# Patient Record
Sex: Female | Born: 1965 | Race: Black or African American | Hispanic: No | Smoking: Never smoker
Health system: Southern US, Community
[De-identification: ages and names within clinical notes are randomized; demographics above are authoritative.]

## PROBLEM LIST (undated history)

## (undated) DIAGNOSIS — R030 Elevated blood-pressure reading, without diagnosis of hypertension: Secondary | ICD-10-CM

## (undated) DIAGNOSIS — E221 Hyperprolactinemia: Secondary | ICD-10-CM

## (undated) DIAGNOSIS — Z8719 Personal history of other diseases of the digestive system: Secondary | ICD-10-CM

## (undated) DIAGNOSIS — M51369 Other intervertebral disc degeneration, lumbar region without mention of lumbar back pain or lower extremity pain: Secondary | ICD-10-CM

## (undated) DIAGNOSIS — K644 Residual hemorrhoidal skin tags: Secondary | ICD-10-CM

## (undated) DIAGNOSIS — K219 Gastro-esophageal reflux disease without esophagitis: Secondary | ICD-10-CM

## (undated) DIAGNOSIS — D352 Benign neoplasm of pituitary gland: Secondary | ICD-10-CM

## (undated) DIAGNOSIS — Z86018 Personal history of other benign neoplasm: Secondary | ICD-10-CM

## (undated) DIAGNOSIS — R202 Paresthesia of skin: Secondary | ICD-10-CM

## (undated) DIAGNOSIS — Z9889 Other specified postprocedural states: Secondary | ICD-10-CM

## (undated) DIAGNOSIS — D649 Anemia, unspecified: Secondary | ICD-10-CM

## (undated) DIAGNOSIS — M5116 Intervertebral disc disorders with radiculopathy, lumbar region: Secondary | ICD-10-CM

## (undated) DIAGNOSIS — G4452 New daily persistent headache (NDPH): Secondary | ICD-10-CM

## (undated) DIAGNOSIS — G43909 Migraine, unspecified, not intractable, without status migrainosus: Secondary | ICD-10-CM

## (undated) DIAGNOSIS — G43109 Migraine with aura, not intractable, without status migrainosus: Secondary | ICD-10-CM

## (undated) DIAGNOSIS — M5136 Other intervertebral disc degeneration, lumbar region: Secondary | ICD-10-CM

## (undated) DIAGNOSIS — G4726 Circadian rhythm sleep disorder, shift work type: Secondary | ICD-10-CM

## (undated) DIAGNOSIS — G8929 Other chronic pain: Secondary | ICD-10-CM

## (undated) HISTORY — PX: BREAST EXCISIONAL BIOPSY: SUR124

## (undated) HISTORY — PX: PITUITARY SURGERY: SHX203

## (undated) HISTORY — PX: DE QUERVAIN'S RELEASE: SHX1439

---

## 1998-03-29 HISTORY — PX: PITUITARY SURGERY: SHX203

## 2019-04-13 MED FILL — SUMATRIPTAN SUCC 100 MG TAB: 100 | 90 days supply | Qty: 27 | Fill #0

## 2019-06-21 ENCOUNTER — Other Ambulatory Visit (HOSPITAL_COMMUNITY): Payer: Self-pay | Admitting: Family Medicine

## 2019-06-21 DIAGNOSIS — Z23 Encounter for immunization: Secondary | ICD-10-CM | POA: Diagnosis not present

## 2019-06-21 DIAGNOSIS — Z0000 Encounter for general adult medical examination without abnormal findings: Secondary | ICD-10-CM | POA: Diagnosis not present

## 2019-06-21 DIAGNOSIS — Z124 Encounter for screening for malignant neoplasm of cervix: Secondary | ICD-10-CM | POA: Diagnosis not present

## 2019-06-21 DIAGNOSIS — Z1231 Encounter for screening mammogram for malignant neoplasm of breast: Secondary | ICD-10-CM | POA: Diagnosis not present

## 2019-06-21 DIAGNOSIS — Z1211 Encounter for screening for malignant neoplasm of colon: Secondary | ICD-10-CM | POA: Diagnosis not present

## 2019-06-21 MED FILL — ESZOPICLONE 2 MG TAB: 2 | 90 days supply | Qty: 90 | Fill #0

## 2019-06-26 ENCOUNTER — Other Ambulatory Visit: Payer: Self-pay | Admitting: Family Medicine

## 2019-06-26 DIAGNOSIS — Z1231 Encounter for screening mammogram for malignant neoplasm of breast: Secondary | ICD-10-CM

## 2019-07-12 MED FILL — SUMATRIPTAN SUCC 100 MG TAB: 100 | 90 days supply | Qty: 27 | Fill #1

## 2019-08-06 ENCOUNTER — Ambulatory Visit
Admission: RE | Admit: 2019-08-06 | Discharge: 2019-08-06 | Disposition: A | Discharge status: Home / Self Care | Payer: 59 | Source: Ambulatory Visit | Attending: Family Medicine | Admitting: Family Medicine

## 2019-08-06 DIAGNOSIS — Z1231 Encounter for screening mammogram for malignant neoplasm of breast: Secondary | ICD-10-CM | POA: Insufficient documentation

## 2019-08-06 IMAGING — MG DIGITAL SCREENING BILAT W/ TOMO W/ CAD
6 of 10 series · 6 of 30 positions shown · non-contrast
Comparison: Previous exam(s).

CLINICAL DATA: Screening.

EXAM:
DIGITAL SCREENING BILATERAL MAMMOGRAM WITH TOMO AND CAD

[R CC synth-2D]
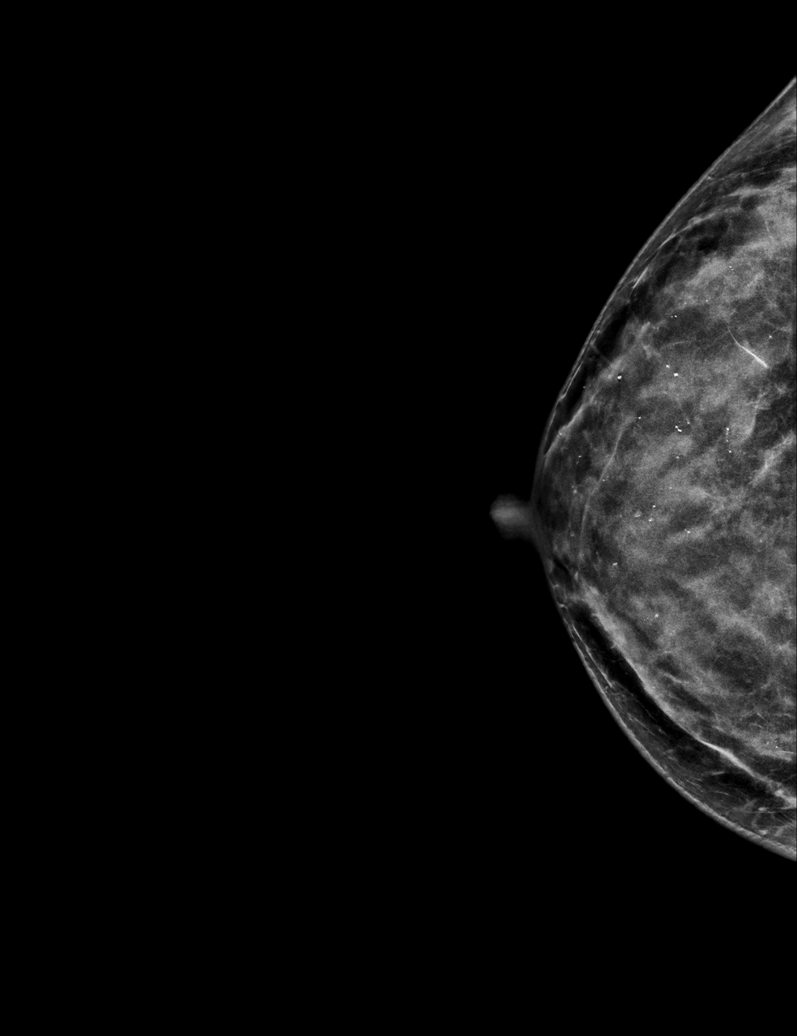

[L CC synth-2D]
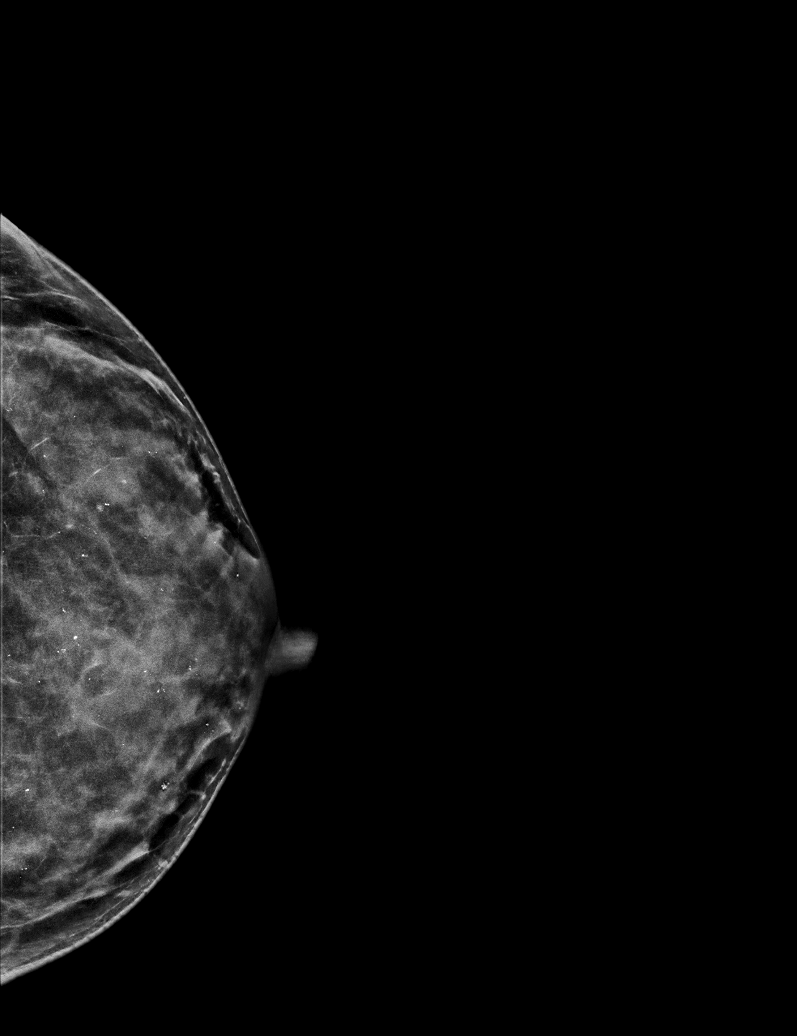

[R MLO synth-2D (1 of 2)]
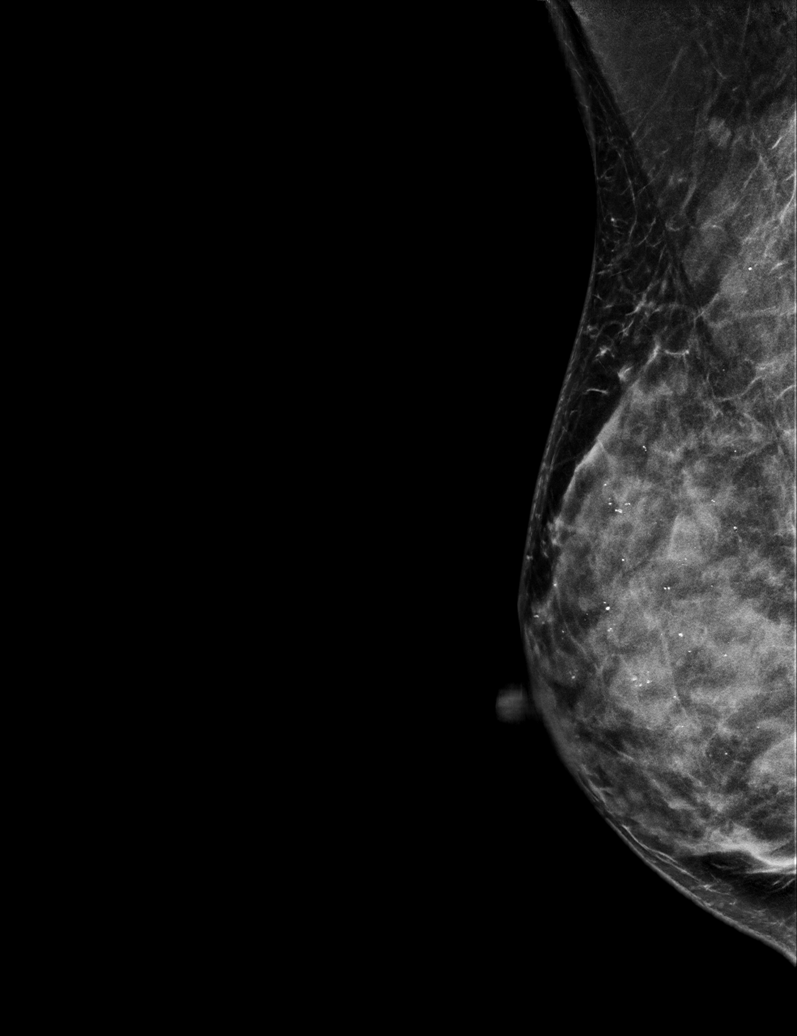

[L MLO synth-2D]
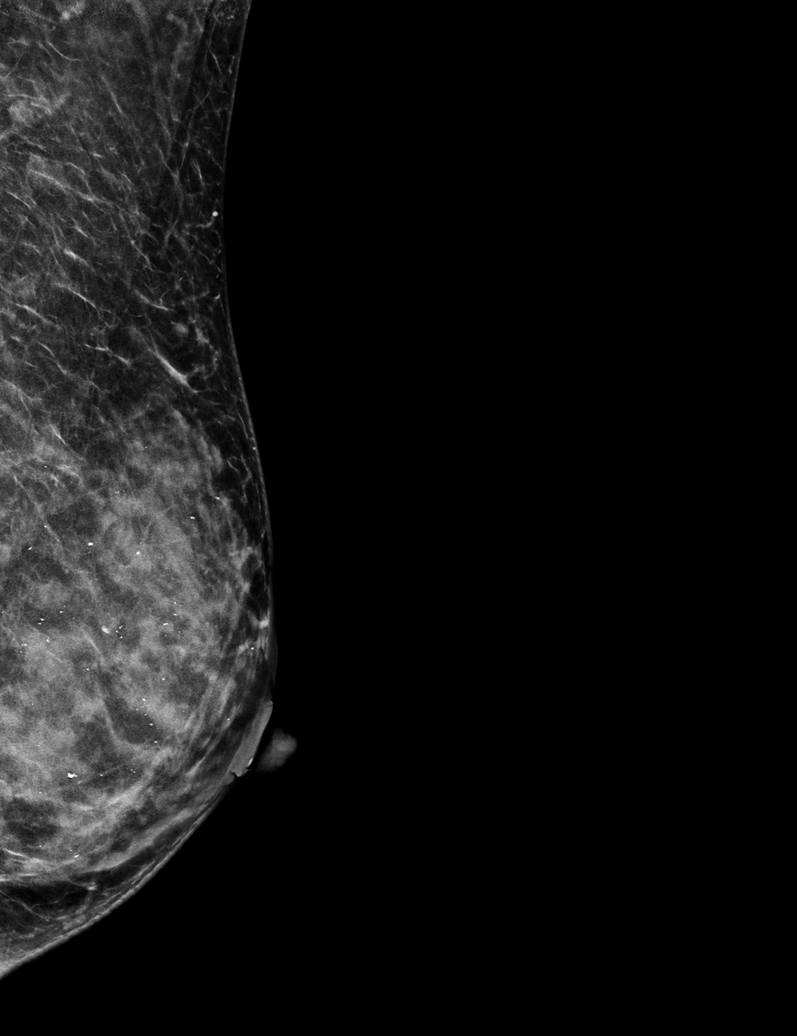

[R MLO synth-2D (2 of 2)]
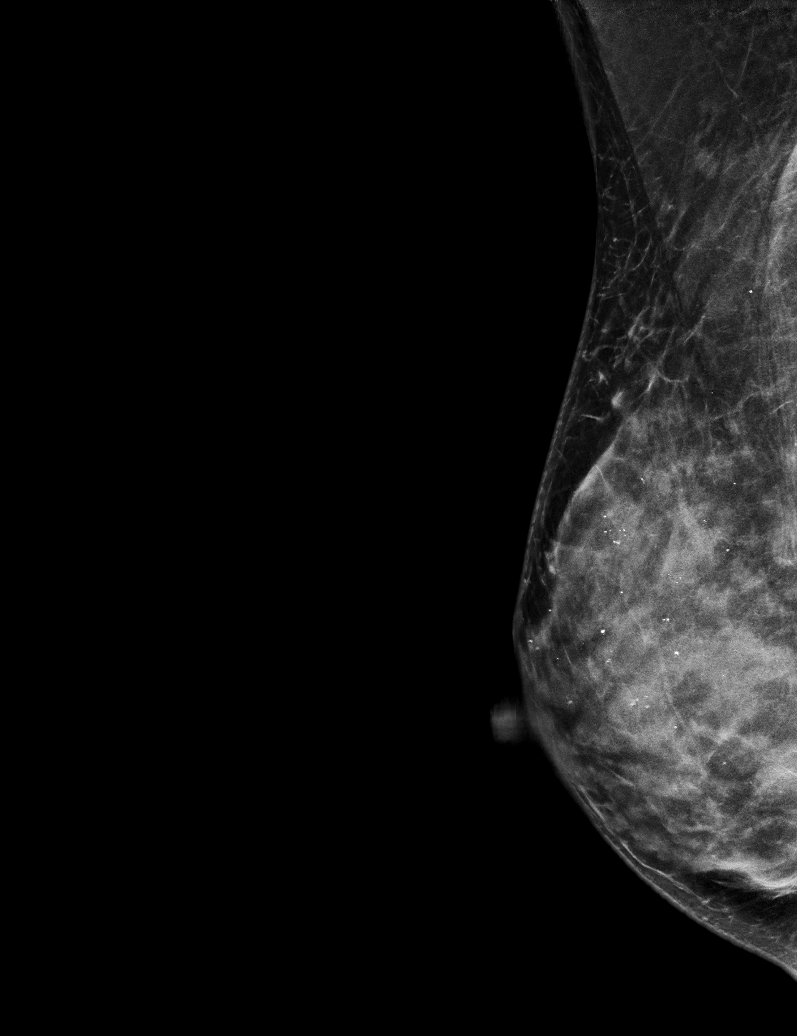

[R MLO tomo · tomo slice 29/58.0]
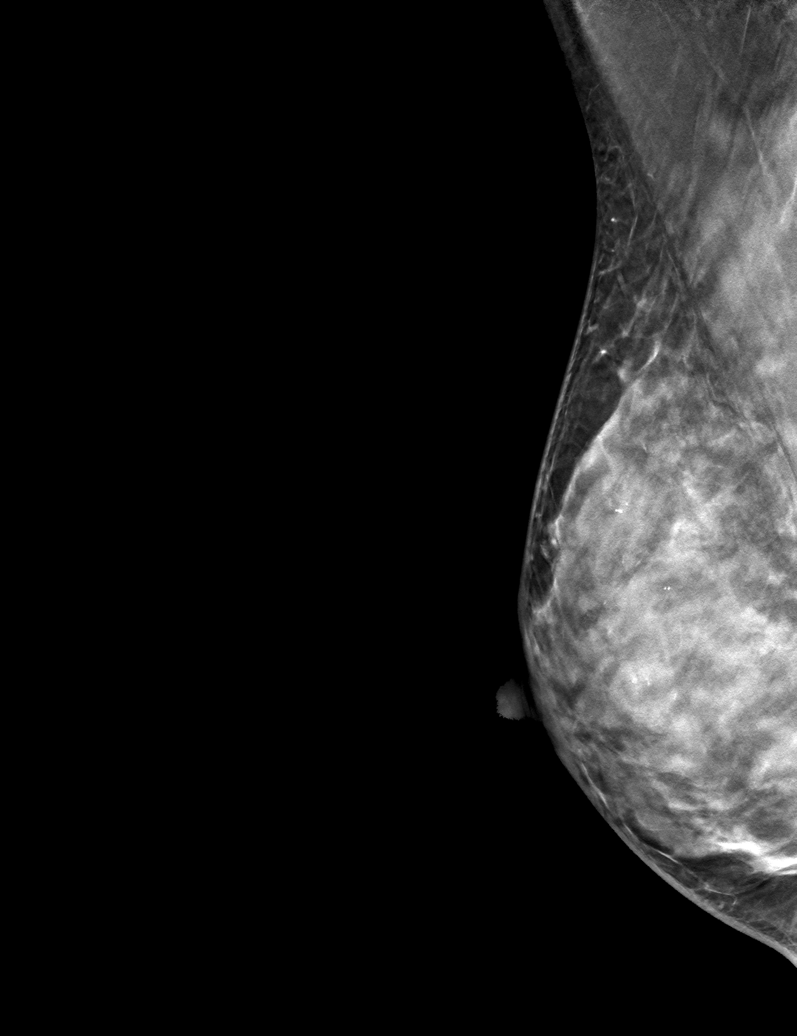

[6 of 30 positions shown; findings below may reference images not displayed]

ACR Breast Density Category c: The breast tissue is heterogeneously
dense, which may obscure small masses.
FINDINGS: There are no findings suspicious for malignancy. Images were
processed with CAD.
IMPRESSION: No mammographic evidence of malignancy. A result letter of this
screening mammogram will be mailed directly to the patient.

RECOMMENDATION:
Screening mammogram in one year. (Code:[5V])

BI-RADS CATEGORY  1: Negative.

## 2019-09-13 DIAGNOSIS — G43109 Migraine with aura, not intractable, without status migrainosus: Secondary | ICD-10-CM | POA: Diagnosis not present

## 2019-09-13 DIAGNOSIS — Z23 Encounter for immunization: Secondary | ICD-10-CM | POA: Diagnosis not present

## 2019-09-13 DIAGNOSIS — R519 Headache, unspecified: Secondary | ICD-10-CM | POA: Diagnosis not present

## 2019-09-13 DIAGNOSIS — R202 Paresthesia of skin: Secondary | ICD-10-CM | POA: Diagnosis not present

## 2019-09-13 DIAGNOSIS — G4452 New daily persistent headache (NDPH): Secondary | ICD-10-CM | POA: Diagnosis not present

## 2019-09-13 DIAGNOSIS — G4726 Circadian rhythm sleep disorder, shift work type: Secondary | ICD-10-CM | POA: Diagnosis not present

## 2019-09-13 MED FILL — FLUTICASONE PROP 50 MCG SPR: 50 | 30 days supply | Qty: 16 | Fill #0

## 2019-09-13 MED FILL — NORTRIPTYLINE HCL 10 MG CAP: 10 | 30 days supply | Qty: 60 | Fill #0

## 2019-09-24 ENCOUNTER — Other Ambulatory Visit: Payer: Self-pay | Admitting: Family Medicine

## 2019-09-24 DIAGNOSIS — Z9889 Other specified postprocedural states: Secondary | ICD-10-CM

## 2019-09-24 DIAGNOSIS — G4452 New daily persistent headache (NDPH): Secondary | ICD-10-CM

## 2019-09-24 DIAGNOSIS — E221 Hyperprolactinemia: Secondary | ICD-10-CM

## 2019-10-03 ENCOUNTER — Ambulatory Visit
Admission: RE | Admit: 2019-10-03 | Discharge: 2019-10-03 | Disposition: A | Discharge status: Home / Self Care | Payer: 59 | Source: Ambulatory Visit | Attending: Family Medicine | Admitting: Family Medicine

## 2019-10-03 ENCOUNTER — Other Ambulatory Visit: Payer: Self-pay

## 2019-10-03 DIAGNOSIS — Z86018 Personal history of other benign neoplasm: Secondary | ICD-10-CM | POA: Insufficient documentation

## 2019-10-03 DIAGNOSIS — E221 Hyperprolactinemia: Secondary | ICD-10-CM | POA: Diagnosis not present

## 2019-10-03 DIAGNOSIS — Z9889 Other specified postprocedural states: Secondary | ICD-10-CM | POA: Diagnosis not present

## 2019-10-03 DIAGNOSIS — G4452 New daily persistent headache (NDPH): Secondary | ICD-10-CM | POA: Diagnosis not present

## 2019-10-03 DIAGNOSIS — R519 Headache, unspecified: Secondary | ICD-10-CM | POA: Diagnosis not present

## 2019-10-03 IMAGING — MR MR HEAD W/O CM
12 of 13 series · 39 of 48 positions shown · non-contrast
Comparison: None.

CLINICAL DATA: Left frontal headaches facial pain 5 months.

EXAM:
MRI HEAD WITHOUT CONTRAST
TECHNIQUE: Multiplanar, multiecho pulse sequences of the brain and surrounding
structures were obtained without intravenous contrast.

[Series 6: ax dwi_tracew · axial · 3.0mm · 0.60mm/px · z∈[-136,+17]mm · 4 of 48 slices shown]
[im 1/48]
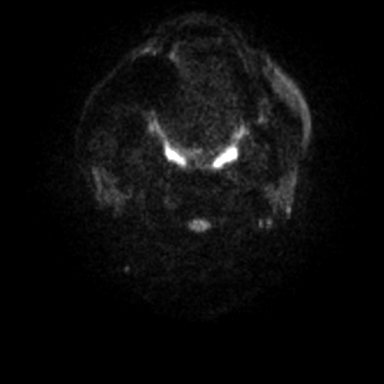
[im 16/48]
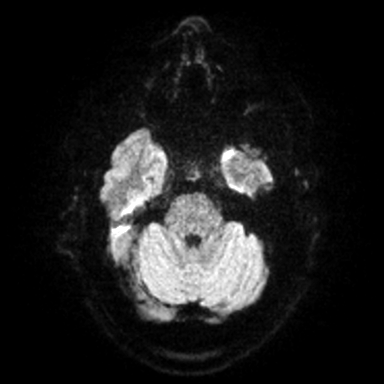
[im 32/48]
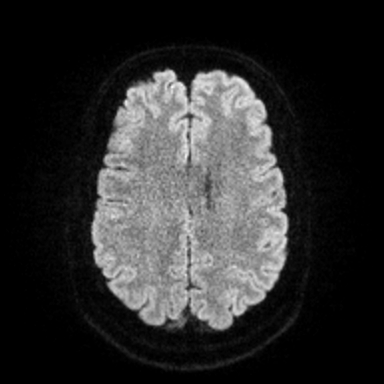
[im 48/48]
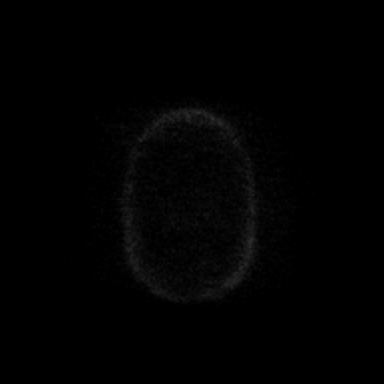

[Series 7: ax dwi_adc · axial · 3.0mm · 0.60mm/px · z∈[-136,+17]mm · 4 of 47 slices shown]
[im 1/47]
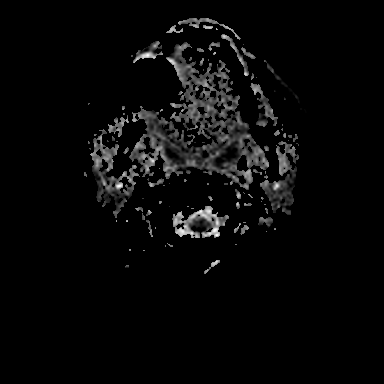
[im 16/47]
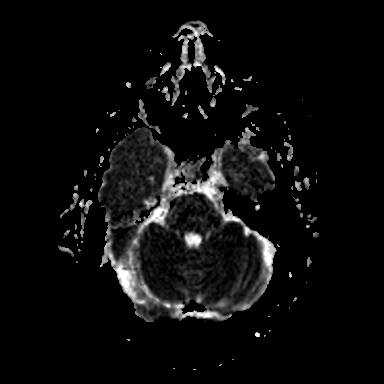
[im 31/47]
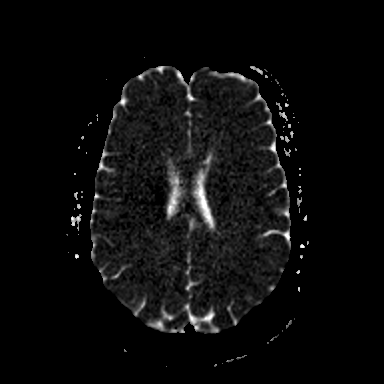
[im 47/47]
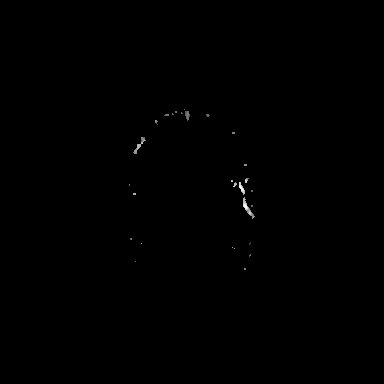

[Series 8: T2 · axial · 5.0mm · 0.53mm/px · z∈[-130,+12]mm · 2 of 25 slices shown (1 of 2)]
[im 1/25]
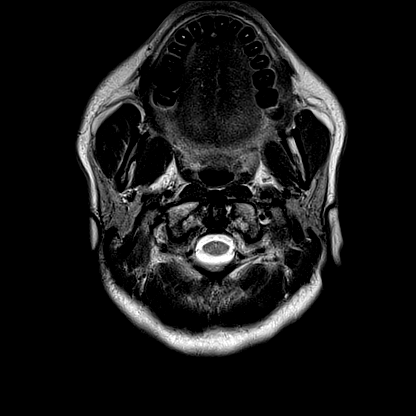
[im 25/25]
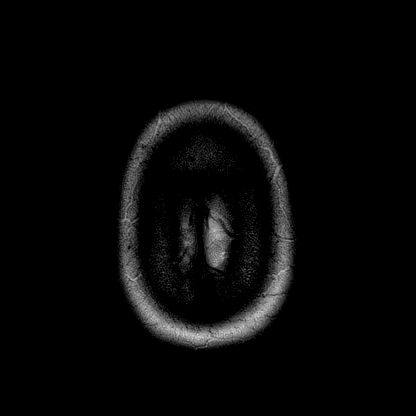

[Series 9: FLAIR · axial · 3.0mm · 0.53mm/px · z∈[-136,+18]mm · 4 of 53 slices shown]
[im 1/53]
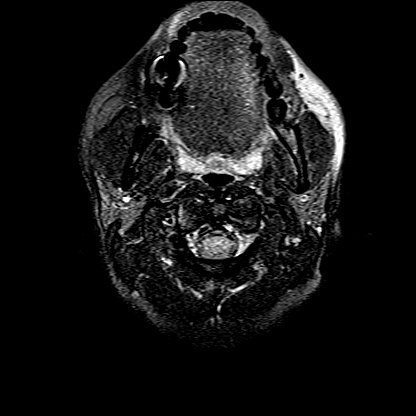
[im 18/53]
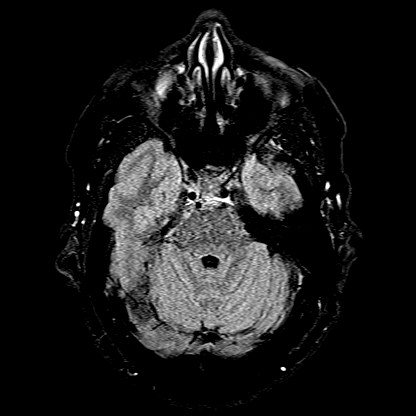
[im 35/53]
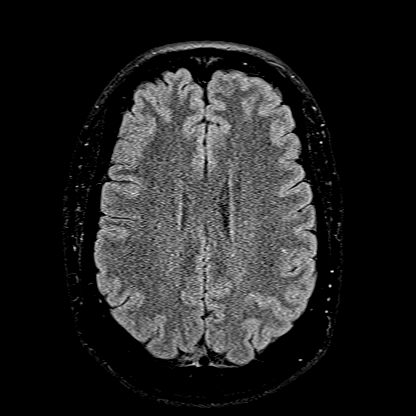
[im 53/53]
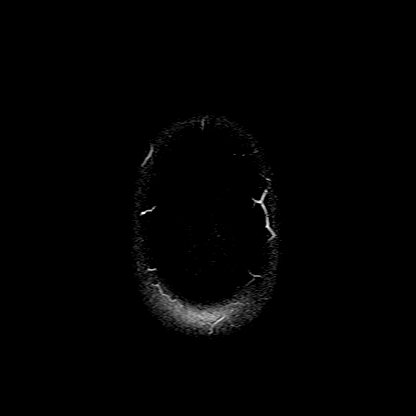

[Series 10: T1 · sagittal · non-contrast · 3.0mm · 0.21mm/px · 1 of 13 slices shown (1 of 3)]
[im 1/13]
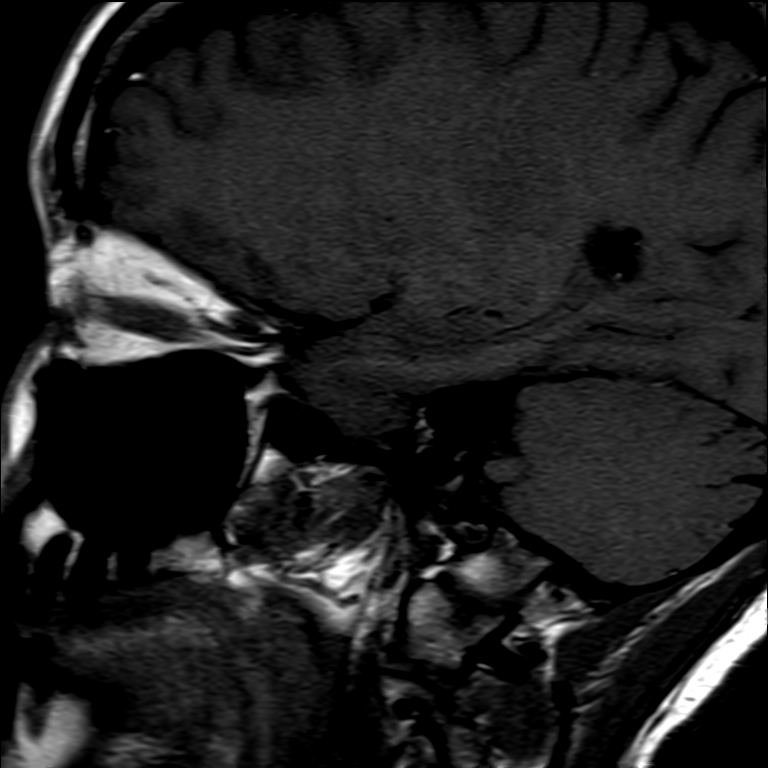

[Series 11: T1 · coronal · non-contrast · 3.0mm · 0.21mm/px · 1 of 11 slices shown (2 of 3)]
[im 1/11]
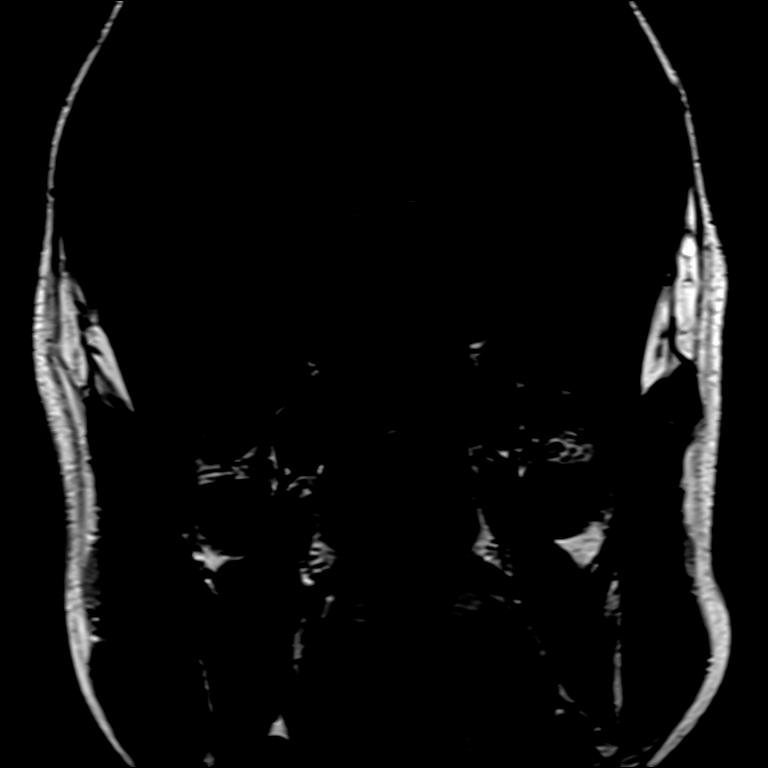

[Series 12: mag_images · axial · 3.0mm · 0.90mm/px · z∈[-136,+15]mm · 4 of 52 slices shown]
[im 1/52]
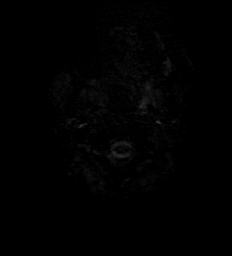
[im 18/52]
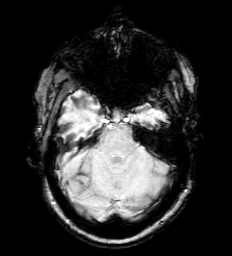
[im 35/52]
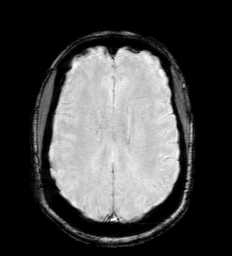
[im 52/52]
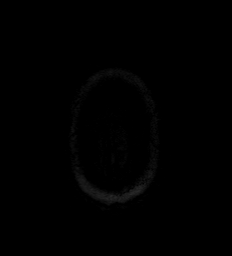

[Series 13: pha_images · axial · 3.0mm · 0.90mm/px · z∈[-136,+15]mm · 4 of 52 slices shown]
[im 1/52]
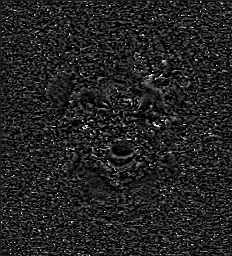
[im 18/52]
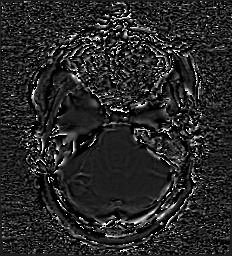
[im 35/52]
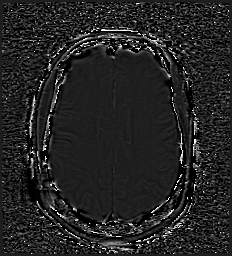
[im 52/52]
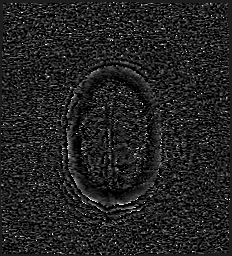

[Series 14: swi_images · axial · 3.0mm · 0.90mm/px · z∈[-136,+15]mm · 4 of 52 slices shown]
[im 1/52]
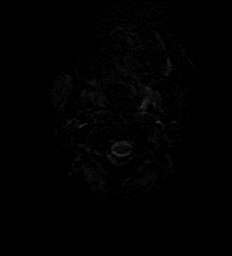
[im 18/52]
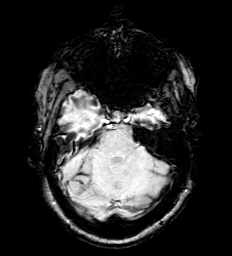
[im 35/52]
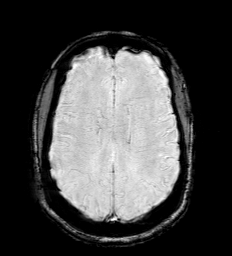
[im 52/52]
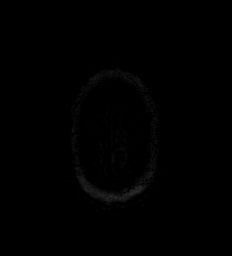

[Series 16: T1 · axial · 1.0mm · 0.98mm/px · z∈[-131,+9]mm · 8 of 144 slices shown (3 of 3)]
[im 1/144]
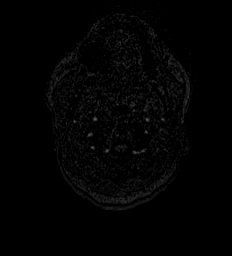
[im 27/144]
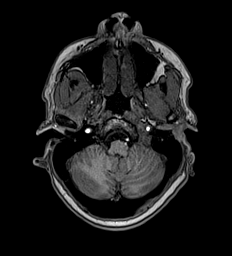
[im 40/144]
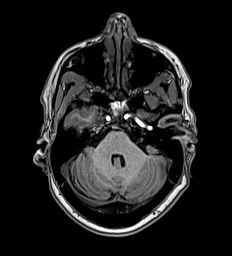
[im 66/144]
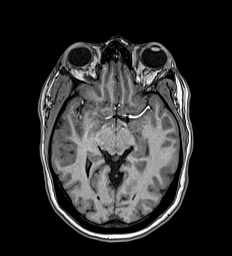
[im 79/144]
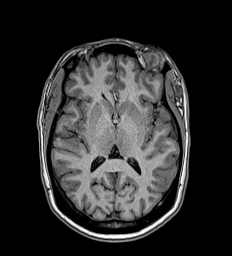
[im 105/144]
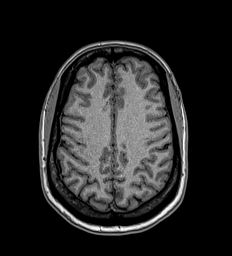
[im 118/144]
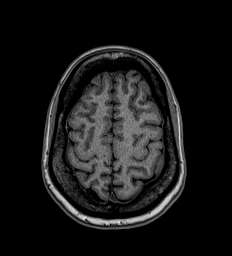
[im 144/144]
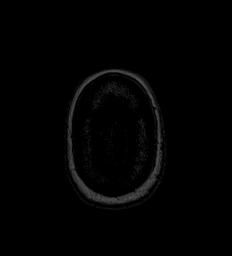

[Series 17: T2 · coronal · 5.0mm · 0.57mm/px · 2 of 27 slices shown (2 of 2)]
[im 1/27]
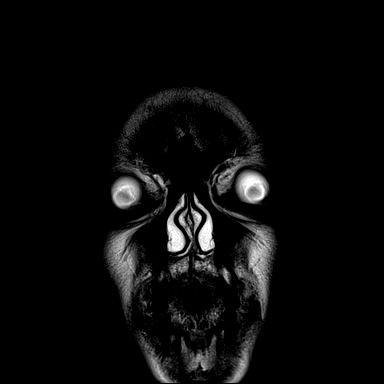
[im 27/27]
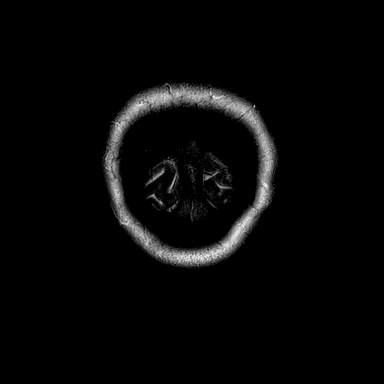

[Series 18: cor dwi_tracew · coronal · 5.0mm · 0.60mm/px · 1 of 34 slices shown]
[im 1/34]
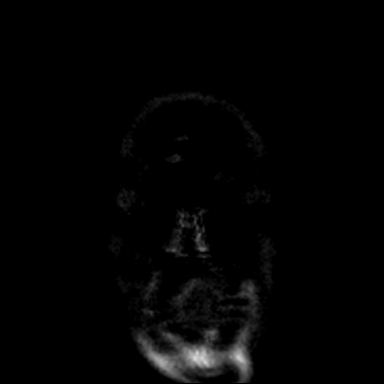

[39 of 48 positions shown; findings below may reference images not displayed]

FINDINGS: Brain: No acute infarct, acute hemorrhage or extra-axial collection.
Normal white matter signal. Normal volume of CSF spaces. Normal
midline structures. No chronic microhemorrhage. Normal unenhanced
appearance of the pituitary gland.

Vascular: Normal flow voids.

Skull and upper cervical spine: Normal marrow signal.

Sinuses/Orbits: Negative.

Other: None.
IMPRESSION: 1. Normal brain MRI.
2. Normal unenhanced appearance of the pituitary gland.

## 2019-10-04 MED FILL — SUMATRIPTAN SUCC 100 MG TAB: 100 | 90 days supply | Qty: 27 | Fill #2

## 2019-10-08 MED FILL — TOPIRAMATE 25 MG TABLET: 25 | 60 days supply | Qty: 120 | Fill #0

## 2019-11-12 ENCOUNTER — Other Ambulatory Visit (HOSPITAL_COMMUNITY): Payer: Self-pay | Admitting: Neurology

## 2019-11-12 DIAGNOSIS — G43719 Chronic migraine without aura, intractable, without status migrainosus: Secondary | ICD-10-CM | POA: Diagnosis not present

## 2019-11-12 MED FILL — TOPIRAMATE 25 MG TABLET: 25 | 30 days supply | Qty: 90 | Fill #0

## 2019-12-17 MED FILL — AMOXICILLIN 500 MG CAPSULE: 500 | 6 days supply | Qty: 22 | Fill #0

## 2020-01-01 ENCOUNTER — Other Ambulatory Visit (HOSPITAL_COMMUNITY): Payer: Self-pay | Admitting: Family Medicine

## 2020-01-01 MED FILL — predniSONE 10 MG (21) TBPK: 10 | 6 days supply | Qty: 21 | Fill #0

## 2020-01-01 MED FILL — CYCLOBENZAPRINE HCL 10 MG T: 10 | 30 days supply | Qty: 90 | Fill #0

## 2020-01-08 ENCOUNTER — Other Ambulatory Visit (HOSPITAL_COMMUNITY): Payer: Self-pay | Admitting: Family Medicine

## 2020-01-09 MED FILL — ESZOPICLONE 2 MG TAB: 2 | 90 days supply | Qty: 90 | Fill #0

## 2020-01-17 DIAGNOSIS — M533 Sacrococcygeal disorders, not elsewhere classified: Secondary | ICD-10-CM | POA: Diagnosis not present

## 2020-01-17 DIAGNOSIS — M461 Sacroiliitis, not elsewhere classified: Secondary | ICD-10-CM | POA: Diagnosis not present

## 2020-01-17 DIAGNOSIS — M545 Low back pain, unspecified: Secondary | ICD-10-CM | POA: Diagnosis not present

## 2020-01-17 DIAGNOSIS — M7918 Myalgia, other site: Secondary | ICD-10-CM | POA: Diagnosis not present

## 2020-01-18 DIAGNOSIS — K219 Gastro-esophageal reflux disease without esophagitis: Secondary | ICD-10-CM | POA: Diagnosis not present

## 2020-01-18 DIAGNOSIS — Z1211 Encounter for screening for malignant neoplasm of colon: Secondary | ICD-10-CM | POA: Diagnosis not present

## 2020-01-31 ENCOUNTER — Other Ambulatory Visit (HOSPITAL_COMMUNITY): Payer: Self-pay | Admitting: Family Medicine

## 2020-01-31 MED FILL — GABAPENTIN 300 MG CAPSULE: 300 | 90 days supply | Qty: 270 | Fill #0

## 2020-02-01 ENCOUNTER — Other Ambulatory Visit: Payer: Self-pay | Admitting: Physician Assistant

## 2020-02-01 ENCOUNTER — Other Ambulatory Visit (HOSPITAL_COMMUNITY): Payer: Self-pay | Admitting: Physician Assistant

## 2020-02-01 DIAGNOSIS — G8929 Other chronic pain: Secondary | ICD-10-CM

## 2020-02-01 DIAGNOSIS — M5416 Radiculopathy, lumbar region: Secondary | ICD-10-CM

## 2020-02-13 ENCOUNTER — Ambulatory Visit
Admission: RE | Admit: 2020-02-13 | Discharge: 2020-02-13 | Disposition: A | Discharge status: Home / Self Care | Payer: 59 | Source: Ambulatory Visit | Attending: Physician Assistant | Admitting: Physician Assistant

## 2020-02-13 ENCOUNTER — Other Ambulatory Visit: Payer: Self-pay

## 2020-02-13 DIAGNOSIS — G8929 Other chronic pain: Secondary | ICD-10-CM | POA: Diagnosis not present

## 2020-02-13 DIAGNOSIS — M5416 Radiculopathy, lumbar region: Secondary | ICD-10-CM | POA: Diagnosis not present

## 2020-02-13 DIAGNOSIS — M5441 Lumbago with sciatica, right side: Secondary | ICD-10-CM | POA: Insufficient documentation

## 2020-02-13 DIAGNOSIS — M545 Low back pain, unspecified: Secondary | ICD-10-CM | POA: Diagnosis not present

## 2020-03-01 DIAGNOSIS — M5116 Intervertebral disc disorders with radiculopathy, lumbar region: Secondary | ICD-10-CM | POA: Diagnosis not present

## 2020-03-05 DIAGNOSIS — M5116 Intervertebral disc disorders with radiculopathy, lumbar region: Secondary | ICD-10-CM | POA: Diagnosis not present

## 2020-03-13 ENCOUNTER — Other Ambulatory Visit: Payer: Self-pay

## 2020-03-13 ENCOUNTER — Other Ambulatory Visit (HOSPITAL_COMMUNITY): Payer: Self-pay

## 2020-03-13 ENCOUNTER — Other Ambulatory Visit
Admission: RE | Admit: 2020-03-13 | Discharge: 2020-03-13 | Disposition: A | Discharge status: Home / Self Care | Payer: 59 | Source: Ambulatory Visit | Attending: Internal Medicine | Admitting: Internal Medicine

## 2020-03-13 DIAGNOSIS — Z20822 Contact with and (suspected) exposure to covid-19: Secondary | ICD-10-CM | POA: Diagnosis not present

## 2020-03-13 DIAGNOSIS — Z01812 Encounter for preprocedural laboratory examination: Secondary | ICD-10-CM | POA: Insufficient documentation

## 2020-03-13 DIAGNOSIS — M5116 Intervertebral disc disorders with radiculopathy, lumbar region: Secondary | ICD-10-CM | POA: Diagnosis not present

## 2020-03-13 DIAGNOSIS — G43719 Chronic migraine without aura, intractable, without status migrainosus: Secondary | ICD-10-CM | POA: Diagnosis not present

## 2020-03-13 DIAGNOSIS — R200 Anesthesia of skin: Secondary | ICD-10-CM | POA: Diagnosis not present

## 2020-03-13 LAB — SARS CORONAVIRUS 2 (TAT 6-24 HRS): SARS Coronavirus 2: NEGATIVE

## 2020-03-13 MED FILL — rOPINIRole HCL 0.25 MG TABS: 0.25 | 90 days supply | Qty: 90 | Fill #0

## 2020-03-14 ENCOUNTER — Encounter: Payer: Self-pay | Admitting: Internal Medicine

## 2020-03-14 ENCOUNTER — Other Ambulatory Visit: Payer: 59

## 2020-03-17 ENCOUNTER — Ambulatory Visit: Payer: 59 | Admitting: Certified Registered"

## 2020-03-17 ENCOUNTER — Encounter: Payer: Self-pay | Admitting: Internal Medicine

## 2020-03-17 ENCOUNTER — Other Ambulatory Visit: Payer: Self-pay

## 2020-03-17 ENCOUNTER — Encounter
Admission: RE | Disposition: A | Discharge status: Home / Self Care | Payer: Self-pay | Source: Home / Self Care | Attending: Internal Medicine

## 2020-03-17 ENCOUNTER — Ambulatory Visit
Admission: RE | Admit: 2020-03-17 | Discharge: 2020-03-17 | Disposition: A | Discharge status: Home / Self Care | Payer: 59 | Attending: Internal Medicine | Admitting: Internal Medicine

## 2020-03-17 DIAGNOSIS — K449 Diaphragmatic hernia without obstruction or gangrene: Secondary | ICD-10-CM | POA: Insufficient documentation

## 2020-03-17 DIAGNOSIS — K2970 Gastritis, unspecified, without bleeding: Secondary | ICD-10-CM | POA: Diagnosis not present

## 2020-03-17 DIAGNOSIS — K640 First degree hemorrhoids: Secondary | ICD-10-CM | POA: Diagnosis not present

## 2020-03-17 DIAGNOSIS — K2100 Gastro-esophageal reflux disease with esophagitis, without bleeding: Secondary | ICD-10-CM | POA: Diagnosis not present

## 2020-03-17 DIAGNOSIS — K219 Gastro-esophageal reflux disease without esophagitis: Secondary | ICD-10-CM | POA: Diagnosis not present

## 2020-03-17 DIAGNOSIS — K648 Other hemorrhoids: Secondary | ICD-10-CM | POA: Diagnosis not present

## 2020-03-17 DIAGNOSIS — Z1211 Encounter for screening for malignant neoplasm of colon: Secondary | ICD-10-CM | POA: Diagnosis not present

## 2020-03-17 DIAGNOSIS — Z79899 Other long term (current) drug therapy: Secondary | ICD-10-CM | POA: Insufficient documentation

## 2020-03-17 HISTORY — DX: Hyperprolactinemia: E22.1

## 2020-03-17 HISTORY — DX: Migraine, unspecified, not intractable, without status migrainosus: G43.909

## 2020-03-17 HISTORY — DX: Residual hemorrhoidal skin tags: K64.4

## 2020-03-17 HISTORY — DX: Circadian rhythm sleep disorder, shift work type: G47.26

## 2020-03-17 HISTORY — DX: Other chronic pain: G89.29

## 2020-03-17 HISTORY — DX: Personal history of other benign neoplasm: Z86.018

## 2020-03-17 HISTORY — DX: Elevated blood-pressure reading, without diagnosis of hypertension: R03.0

## 2020-03-17 HISTORY — DX: Other intervertebral disc degeneration, lumbar region: M51.36

## 2020-03-17 HISTORY — PX: ESOPHAGOGASTRODUODENOSCOPY (EGD) WITH PROPOFOL: SHX5813

## 2020-03-17 HISTORY — DX: Migraine with aura, not intractable, without status migrainosus: G43.109

## 2020-03-17 HISTORY — DX: Paresthesia of skin: R20.2

## 2020-03-17 HISTORY — PX: COLONOSCOPY WITH PROPOFOL: SHX5780

## 2020-03-17 HISTORY — DX: Intervertebral disc disorders with radiculopathy, lumbar region: M51.16

## 2020-03-17 HISTORY — DX: Other specified postprocedural states: Z98.890

## 2020-03-17 HISTORY — DX: New daily persistent headache (ndph): G44.52

## 2020-03-17 HISTORY — DX: Other intervertebral disc degeneration, lumbar region without mention of lumbar back pain or lower extremity pain: M51.369

## 2020-03-17 SURGERY — COLONOSCOPY WITH PROPOFOL
Anesthesia: General

## 2020-03-17 SURGERY — ESOPHAGOGASTRODUODENOSCOPY (EGD) WITH PROPOFOL
Anesthesia: General

## 2020-03-17 MED ORDER — SODIUM CHLORIDE 0.9 % IV SOLN: INTRAVENOUS | Status: DC

## 2020-03-17 MED ORDER — PROPOFOL 10 MG/ML IV BOLUS
INTRAVENOUS | Status: DC | PRN
  Administered 2020-03-17: 100 mg via INTRAVENOUS

## 2020-03-17 MED ORDER — LIDOCAINE HCL (CARDIAC) PF 100 MG/5ML IV SOSY
PREFILLED_SYRINGE | INTRAVENOUS | Status: DC | PRN
  Administered 2020-03-17: 50 mg via INTRAVENOUS

## 2020-03-17 MED ORDER — PROPOFOL 500 MG/50ML IV EMUL
INTRAVENOUS | Status: DC | PRN
  Administered 2020-03-17: 120 ug/kg/min via INTRAVENOUS

## 2020-03-17 MED ORDER — PROPOFOL 500 MG/50ML IV EMUL
INTRAVENOUS | Status: AC
  Filled 2020-03-17: qty 50

## 2020-03-17 MED ORDER — PROPOFOL 10 MG/ML IV BOLUS
INTRAVENOUS | Status: AC
  Filled 2020-03-17: qty 20

## 2020-03-17 MED ORDER — GLYCOPYRROLATE 0.2 MG/ML IJ SOLN
INTRAMUSCULAR | Status: DC | PRN
  Administered 2020-03-17: .2 mg via INTRAVENOUS

## 2020-03-17 NOTE — Transfer of Care (Signed)
Immediate Anesthesia Transfer of Care Note  Patient: Kathleen Diaz  Procedure(s) Performed: ESOPHAGOGASTRODUODENOSCOPY (EGD) WITH PROPOFOL (N/A ) COLONOSCOPY WITH PROPOFOL (N/A )  Patient Location: PACU and Endoscopy Unit  Anesthesia Type:General  Level of Consciousness: drowsy  Airway & Oxygen Therapy: Patient Spontanous Breathing  Post-op Assessment: Report given to RN  Post vital signs: stable  Last Vitals:  Vitals Value Taken Time  BP    Temp    Pulse    Resp    SpO2      Last Pain:  Vitals:   03/17/20 0747  TempSrc: Temporal  PainSc: 0-No pain         Complications: No complications documented.

## 2020-03-17 NOTE — Op Note (Signed)
[facility] Gastroenterology Patient Name: Vannah Nadal Procedure Date: 03/17/2020 8:10 AM MRN: [id number] Account #: [id number] Date of Birth: 04-12-1965 Admit Type: Outpatient Age: 54 Room: [facility] ENDO ROOM 2 Gender: Female Note Status: Finalized Procedure:             Colonoscopy Indications:           Screening for colorectal malignant neoplasm Providers:             [name]. Alice Reichert MD, MD Referring MD:          Ray Church, MD Medicines:             Propofol per Anesthesia Complications:         No immediate complications. Procedure:             Pre-Anesthesia Assessment:                        - The risks and benefits of the procedure and the                         sedation options and risks were discussed with the                         patient. All questions were answered and informed                         consent was obtained.                        - Patient identification and proposed procedure were                         verified prior to the procedure by the nurse. The                         procedure was verified in the procedure room.                        - ASA Grade Assessment: III - A patient with severe                         systemic disease.                        - After reviewing the risks and benefits, the patient                         was deemed in satisfactory condition to undergo the                         procedure.                        After obtaining informed consent, the colonoscope was                         passed under direct vision. Throughout the procedure,                         the patient's blood pressure, pulse, and oxygen  saturations were monitored continuously. The                         Colonoscope was introduced through the anus and                         advanced to the the cecum, identified by appendiceal                         orifice and ileocecal valve. The  colonoscopy was                         performed without difficulty. The patient tolerated                         the procedure well. The quality of the bowel                         preparation was excellent. The ileocecal valve,                         appendiceal orifice, and rectum were photographed. Findings:      The perianal and digital rectal examinations were normal. Pertinent       negatives include normal sphincter tone and no palpable rectal lesions.      The colon (entire examined portion) appeared normal.      Non-bleeding internal hemorrhoids were found during retroflexion. The       hemorrhoids were Grade I (internal hemorrhoids that do not prolapse). Impression:            - The entire examined colon is normal.                        - Non-bleeding internal hemorrhoids.                        - No specimens collected. Recommendation:        - Await pathology results from EGD, also performed                         today.                        - Patient has a contact number available for                         emergencies. The signs and symptoms of potential                         delayed complications were discussed with the patient.                         Return to normal activities tomorrow. Written                         discharge instructions were provided to the patient.                        - Resume previous diet.                        -  Continue present medications.                        - Repeat colonoscopy in 10 years for screening                         purposes.                        - Return to GI office in 1 year.                        - The findings and recommendations were discussed with                         the patient. Procedure Code(s):     --- Professional ---                        Y5859, Colorectal cancer screening; colonoscopy on                         individual not meeting criteria for high risk Diagnosis Code(s):     ---  Professional ---                        K64.0, First degree hemorrhoids                        Z12.11, Encounter for screening for malignant neoplasm                         of colon CPT copyright 2019 American [employer]. All rights reserved. The codes documented in this report are preliminary and upon coder review may  be revised to meet current compliance requirements. Efrain Sella MD, MD 03/17/2020 9:12:48 AM This report has been signed electronically. Number of Addenda: 0 Note Initiated On: 03/17/2020 8:10 AM Scope Withdrawal Time: 0 hours 6 minutes 7 seconds  Total Procedure Duration: 0 hours 10 minutes 57 seconds  Estimated Blood Loss:  Estimated blood loss: none.      [facility]

## 2020-03-17 NOTE — Anesthesia Postprocedure Evaluation (Signed)
Anesthesia Post Note  Patient: Icela Glymph  Procedure(s) Performed: ESOPHAGOGASTRODUODENOSCOPY (EGD) WITH PROPOFOL (N/A ) COLONOSCOPY WITH PROPOFOL (N/A )  Patient location during evaluation: PACU Anesthesia Type: General Level of consciousness: awake and alert Pain management: pain level controlled Vital Signs Assessment: post-procedure vital signs reviewed and stable Respiratory status: spontaneous breathing, nonlabored ventilation and respiratory function stable Cardiovascular status: blood pressure returned to baseline and stable Postop Assessment: no apparent nausea or vomiting Anesthetic complications: no   No complications documented.   Last Vitals:  Vitals:   03/17/20 0913 03/17/20 0933  BP: 121/65 110/75  Pulse: 86   Resp: 20 (!) 22  Temp: 36.8 C   SpO2: 100%     Last Pain:  Vitals:   03/17/20 0933  TempSrc:   PainSc: 0-No pain                 Tera Mater

## 2020-03-17 NOTE — Op Note (Signed)
[facility] Gastroenterology Patient Name: Kathleen Diaz Procedure Date: 03/17/2020 8:12 AM MRN: [id number] Account #: [id number] Date of Birth: March 13, 1966 Admit Type: Outpatient Age: 54 Room: [facility] ENDO ROOM 2 Gender: Female Note Status: Finalized Procedure:             Upper GI endoscopy Indications:           Esophageal reflux Providers:             [name]. Alice Reichert MD, MD Referring MD:          Ray Church, MD Medicines:             Propofol per Anesthesia Complications:         No immediate complications. Procedure:             Pre-Anesthesia Assessment:                        - The risks and benefits of the procedure and the                         sedation options and risks were discussed with the                         patient. All questions were answered and informed                         consent was obtained.                        - Patient identification and proposed procedure were                         verified prior to the procedure by the nurse. The                         procedure was verified in the procedure room.                        - ASA Grade Assessment: III - A patient with severe                         systemic disease.                        - After reviewing the risks and benefits, the patient                         was deemed in satisfactory condition to undergo the                         procedure.                        After obtaining informed consent, the endoscope was                         passed under direct vision. Throughout the procedure,                         the patient's blood pressure, pulse, and oxygen  saturations were monitored continuously. The Endoscope                         was introduced through the mouth, and advanced to the                         third part of duodenum. The upper GI endoscopy was                         accomplished without difficulty. The patient  tolerated                         the procedure well. Findings:      Mucosal changes including feline appearance were found in the entire       esophagus. Esophageal findings were graded using the Eosinophilic       Esophagitis Endoscopic Reference Score (EoE-EREFS) as: Edema Grade 0       Normal (distinct vascular markings), Rings Grade 1 Mild (subtle       circumferential ridges seen on esophageal distension), Exudates Grade 0       None (no white lesions seen), Furrows Grade 0 None (no vertical lines       seen) and Stricture none (no stricture found). Biopsies were obtained       from the proximal and distal esophagus with cold forceps for histology       of suspected eosinophilic esophagitis.      The Z-line was irregular and was found from 36 to 36.4cm from the       incisors.. Mucosa was biopsied with a cold forceps for histology. One       specimen bottle was sent to pathology.      Patchy minimal inflammation characterized by erythema was found in the       gastric antrum.      A 1 cm hiatal hernia was present.      The examined duodenum was normal.      The exam was otherwise without abnormality. Impression:            - Esophageal mucosal changes suspicious for                         eosinophilic esophagitis. Biopsied.                        - Z-line irregular. Biopsied.                        - Gastritis.                        - 1 cm hiatal hernia.                        - Normal examined duodenum.                        - The examination was otherwise normal. Recommendation:        - Await pathology results.                        - Proceed with colonoscopy Procedure Code(s):     --- Professional ---  34373, Esophagogastroduodenoscopy, flexible,                         transoral; with biopsy, single or multiple Diagnosis Code(s):     --- Professional ---                        K21.9, Gastro-esophageal reflux disease without                          esophagitis                        K44.9, Diaphragmatic hernia without obstruction or                         gangrene                        K29.70, Gastritis, unspecified, without bleeding                        K22.8, Other specified diseases of esophagus CPT copyright 2019 American Medical Association. All rights reserved. The codes documented in this report are preliminary and upon coder review may  be revised to meet current compliance requirements. Efrain Sella MD, MD 03/17/2020 8:57:05 AM This report has been signed electronically. Number of Addenda: 0 Note Initiated On: 03/17/2020 8:12 AM Estimated Blood Loss:  Estimated blood loss: none.      [facility]

## 2020-03-17 NOTE — Interval H&P Note (Signed)
History and Physical Interval Note:  03/17/2020 8:43 AM  Kathleen Diaz  has presented today for surgery, with the diagnosis of CC screening , GERD.  The various methods of treatment have been discussed with the patient and family. After consideration of risks, benefits and other options for treatment, the patient has consented to  Procedure(s): ESOPHAGOGASTRODUODENOSCOPY (EGD) WITH PROPOFOL (N/A) COLONOSCOPY WITH PROPOFOL (N/A) as a surgical intervention.  The patient's history has been reviewed, patient examined, no change in status, stable for surgery.  I have reviewed the patient's chart and labs.  Questions were answered to the patient's satisfaction.     [place], [place]

## 2020-03-17 NOTE — H&P (Signed)
Outpatient short stay form Pre-procedure 03/17/2020 8:42 AM Kathleen Diaz [name], M.D.  Primary Physician: Ray Church, M.D.  Reason for visit: GERD, colon cancer screening  History of present illness: Patient presents for colonoscopy for colon cancer screening. The patient denies complaints of abdominal pain, significant change in bowel habits, or rectal bleeding.  Patient with chronic well controlled GERD. Patient denies intractable heartburn, dysphagia, hemetemesis, abdominal pain, nausea or vomiting.     Current Facility-Administered Medications:  .  0.9 %  sodium chloride infusion, , Intravenous, Continuous, [place], [name], MD, Last Rate: 20 mL/hr at 03/17/20 0809, New Bag at 03/17/20 0809  Medications Prior to Admission  Medication Sig Dispense Refill Last Dose  . cyclobenzaprine (FLEXERIL) 10 MG tablet Take 10 mg by mouth 3 (three) times daily as needed for muscle spasms.   03/16/2020 at 2200  . eszopiclone (LUNESTA) 2 MG TABS tablet Take 2 mg by mouth at bedtime as needed for sleep. Take immediately before bedtime   Past Month at Unknown time  . fluticasone (FLONASE) 50 MCG/ACT nasal spray Place into both nostrils daily.   Past Week at Unknown time  . gabapentin (NEURONTIN) 300 MG capsule Take 300 mg by mouth 3 (three) times daily.   03/16/2020 at 2200  . hydrocortisone (ANUSOL-HC) 25 MG suppository Place 25 mg rectally 2 (two) times daily.   Past Week at Unknown time  . SUMAtriptan (IMITREX) 100 MG tablet Take 100 mg by mouth every 2 (two) hours as needed for migraine. May repeat in 2 hours if headache persists or recurs.   Past Week at Unknown time  . topiramate (TOPAMAX) 25 MG capsule Take 25 mg by mouth 2 (two) times daily.   03/16/2020 at 0800     Not on File   Past Medical History:  Diagnosis Date  . Chronic bilateral low back pain with left-sided sciatica   . DDD (degenerative disc disease), lumbar   . External hemorrhoids   . Hyperprolactinemia ([place])   .  Intervertebral disc disorder with radiculopathy of lumbar region   . Migraine with aura and without status migrainosus   . Migraines   . New daily persistent headache   . Paresthesia of hand, bilateral   . Paresthesia of left foot   . Prehypertension   . S/P selective transsphenoidal pituitary adenomectomy   . Shift work sleep disorder     Review of systems:  Otherwise negative.    Physical Exam  Gen: Alert, oriented. Appears stated age.  HEENT: [place]/AT. PERRLA. Lungs: CTA, no wheezes. CV: RR nl S1, S2. Abd: soft, benign, no masses. BS+ Ext: No edema. Pulses 2+    Planned procedures: Proceed with EGD and colonoscopy. The patient understands the nature of the planned procedure, indications, risks, alternatives and potential complications including but not limited to bleeding, infection, perforation, damage to internal organs and possible oversedation/side effects from anesthesia. The patient agrees and gives consent to proceed.  Please refer to procedure notes for findings, recommendations and patient disposition/instructions.     [name] [name], M.D. Gastroenterology 03/17/2020  8:42 AM

## 2020-03-17 NOTE — Anesthesia Preprocedure Evaluation (Addendum)
Anesthesia Evaluation  Patient identified by MRN, date of birth, ID band Patient awake    Reviewed: Allergy & Precautions, H&P , NPO status , Patient's Chart, lab work & pertinent test results  History of Anesthesia Complications Negative for: history of anesthetic complications  Airway Mallampati: II  TM Distance: >3 FB     Dental  (+) Teeth Intact   Pulmonary neg pulmonary ROS, neg sleep apnea, neg COPD,    breath sounds clear to auscultation       Cardiovascular (-) angina(-) Past MI and (-) Cardiac Stents negative cardio ROS  (-) dysrhythmias  Rhythm:regular Rate:Normal     Neuro/Psych  Headaches, H/o prolactinoma s/p resection Sciatica, lumbar radiculopathy negative psych ROS   GI/Hepatic negative GI ROS, Neg liver ROS,   Endo/Other  negative endocrine ROS  Renal/GU negative Renal ROS  negative genitourinary   Musculoskeletal   Abdominal   Peds  Hematology negative hematology ROS (+)   Anesthesia Other Findings Past Medical History: No date: Chronic bilateral low back pain with left-sided sciatica No date: DDD (degenerative disc disease), lumbar No date: External hemorrhoids No date: Hyperprolactinemia ([facility]) No date: Intervertebral disc disorder with radiculopathy of lumbar  region No date: Migraine with aura and without status migrainosus No date: Migraines No date: New daily persistent headache No date: Paresthesia of hand, bilateral No date: Paresthesia of left foot No date: Prehypertension No date: S/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  Past Surgical History: No date: BREAST EXCISIONAL BIOPSY     Reproductive/Obstetrics negative OB ROS                            Anesthesia Physical Anesthesia Plan  ASA: II  Anesthesia Plan: General   Post-op Pain Management:    Induction:   PONV Risk Score and Plan: Propofol  infusion and TIVA  Airway Management Planned: Nasal Cannula  Additional Equipment:   Intra-op Plan:   Post-operative Plan:   Informed Consent: I have reviewed the patients History and Physical, chart, labs and discussed the procedure including the risks, benefits and alternatives for the proposed anesthesia with the patient or authorized representative who has indicated his/her understanding and acceptance.     Dental Advisory Given  Plan Discussed with: Anesthesiologist, CRNA and Surgeon  Anesthesia Plan Comments:         Anesthesia Quick Evaluation

## 2020-03-18 ENCOUNTER — Encounter: Payer: Self-pay | Admitting: Internal Medicine

## 2020-03-18 LAB — SURGICAL PATHOLOGY

## 2020-04-04 ENCOUNTER — Other Ambulatory Visit: Payer: 59

## 2020-04-04 DIAGNOSIS — Z20822 Contact with and (suspected) exposure to covid-19: Secondary | ICD-10-CM

## 2020-04-07 LAB — NOVEL CORONAVIRUS, NAA: SARS-CoV-2, NAA: DETECTED — AB

## 2020-04-07 MED FILL — SUMATRIPTAN SUCCINATE 100 M: 100 | 30 days supply | Qty: 9 | Fill #0

## 2020-04-07 MED FILL — CYCLOBENZAPRINE HCL 10 MG T: 10 | 30 days supply | Qty: 90 | Fill #1

## 2020-04-07 MED FILL — ESZOPICLONE 2 MG TABS: 2 | 90 days supply | Qty: 90 | Fill #1

## 2020-04-30 ENCOUNTER — Ambulatory Visit: Payer: 59 | Attending: Neurosurgery

## 2020-04-30 ENCOUNTER — Other Ambulatory Visit: Payer: Self-pay

## 2020-04-30 DIAGNOSIS — R293 Abnormal posture: Secondary | ICD-10-CM | POA: Diagnosis not present

## 2020-04-30 DIAGNOSIS — M545 Low back pain, unspecified: Secondary | ICD-10-CM | POA: Diagnosis not present

## 2020-04-30 NOTE — Therapy (Signed)
[place] MAIN [facility] SERVICES [address] [place], [place], [address] Phone: [phone]   Fax:  [phone]  Physical Therapy Evaluation  Patient Details  Name: Kathleen Diaz MRN: [id number] Date of Birth: [birth year] Referring Provider (PT): Meade Maw   Encounter Date: 04/30/2020   PT End of Session - 04/30/20 1732    Visit Number 1    Number of Visits 16    Date for PT Re-Evaluation 06/25/20    Authorization Type 1/10 eval 04/30/20    PT Start Time 0716    PT Stop Time 0815    PT Time Calculation (min) 59 min    Activity Tolerance Patient tolerated treatment well    Behavior During Therapy [facility] for tasks assessed/performed           Past Medical History:  Diagnosis Date  . Chronic bilateral low back pain with left-sided sciatica   . DDD (degenerative disc disease), lumbar   . External hemorrhoids   . Hyperprolactinemia ([place])   . Intervertebral disc disorder with radiculopathy of lumbar region   . Migraine with aura and without status migrainosus   . Migraines   . New daily persistent headache   . Paresthesia of hand, bilateral   . Paresthesia of left foot   . Prehypertension   . S/P selective transsphenoidal pituitary adenomectomy   . Shift work sleep disorder     Past Surgical History:  Procedure Laterality Date  . BREAST EXCISIONAL BIOPSY    . COLONOSCOPY WITH PROPOFOL N/A 03/17/2020   Procedure: COLONOSCOPY WITH PROPOFOL;  Surgeon: Toledo, [name], MD;  Location: [facility] ENDOSCOPY;  Service: Gastroenterology;  Laterality: N/A;  . ESOPHAGOGASTRODUODENOSCOPY (EGD) WITH PROPOFOL N/A 03/17/2020   Procedure: ESOPHAGOGASTRODUODENOSCOPY (EGD) WITH PROPOFOL;  Surgeon: Toledo, [name], MD;  Location: [facility] ENDOSCOPY;  Service: Gastroenterology;  Laterality: N/A;  . PITUITARY SURGERY      There were no vitals filed for this visit.    Subjective Assessment - 04/30/20 0726    Subjective Patient presents for low back pain  evaluation.    Pertinent History Patient presents for low back pain, left SI joint pain, numbness in bottom of right foot. Symptoms began October 3rd 2021. She had her right foot elevated and placed it down feeling something in her left lower back. History of sciatic pain intermittently for 10 years. Made worse with bending forward, exercise, lifting, and sitting.  MRI  showed small left sided disc protrusion L4-5 with Left L5 nerve root impingement. Patient had an injection 03/05/20, didn't benefit until 5-6 weeks later. 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 Wants to return to running, be able to do hills, and have less pain.    Limitations Sitting;Lifting;Standing;Walking;House hold activities;Other (comment)   hills and stairs   How long can you sit comfortably? painful with sitting, especially when knees are at 90 degrees or less    How long can you stand comfortably? painful, requires use of brace at work.    How long can you walk comfortably? requires use of brace at work    Diagnostic tests MRI: small left sided disc protrusion L4-5 with Left L5 nerve root impingement.    Patient Stated Goals to return to walking, improve strength,    Currently in Pain? Yes    Pain Score 2     Pain Location Back    Pain Orientation Lower    Pain Descriptors / Indicators Aching    Pain Type Acute  pain    Pain Radiating Towards bilateral LE    Pain Onset More than a month ago    Pain Frequency Intermittent    Aggravating Factors  sitting with knees above 90 degrees, prolonged standing, forward reaching.              [facility] PT Assessment - 04/30/20 0001      Assessment   Medical Diagnosis low back pain    Referring Provider (PT) Meade Maw    Onset Date/Surgical Date 12/30/19    Hand Dominance Right      Precautions   Precautions None      Restrictions   Weight Bearing Restrictions No      Balance Screen   Has the patient fallen in the past  6 months Yes    How many times? 1   fell onto bottom and down stairs at home   Has the patient had a decrease in activity level because of a fear of falling?  Yes    Is the patient reluctant to leave their home because of a fear of falling?  No      Home Social worker Private residence    Living Arrangements Spouse/significant other    Available Help at Discharge Family    Type of [place] to enter    Entrance Stairs-Number of Steps 4 garage, 8 front stairs    Home Layout Two level      Prior Function   Level of Independence Independent    Vocation Full time employment    Vocation Requirements Patient is a Hospitalist (night shift) at [employer]      Cognition   Overall Cognitive Status Within Functional Limits for tasks assessed      Observation/Other Assessments   Focus on Therapeutic Outcomes (FOTO)  46.8%              PAIN: Worst Pain: 8/10 Triggers by: sitting in bucket seat for prolonged periods. (<90 degree position  POSTURE: Seated: slight hip shift onto R hip, limited ability to retain still position Standing: excessive lumbar lordosis, slight kyphotic posture   PROM/AROM:  Trunk Flexion Limited 10% by muscle guarding  Trunk Extension WFL, pian relieving   Trunk R SB 10% limitation, muscle guarding  Trunk L SB 10% limitation, muscle guarding  Trunk R rotation 10% limitation, muscle guarding  Trunk L rotation 10%, limitation, muscle guarding      AROM BLE:  Slight hamstring limitation, piriformis limitation bilaterally LE BLE   Accessory Motions:  CPA and UPAs: Painful L 4 CPA, relieved pain L4 L and R UPA Lumbar spine: hypomobile with muscle guarding Sacrum: inferior glide: painful CPA, pain relieving UPA R and L   STRENGTH:  Graded on a 0-5 scale Muscle Group Left Right  Hip Flex 4+/5 4+/5  Hip Abd 4/5 4+/5  Hip Add 4/5 4/5  Hip Ext 3+/5 4-/5  Hip IR/ER /5 /5  Knee Flex 3+/5 4/5   Knee Ext 4/5 4+/5  Ankle DF 4/5 4/5  Ankle PF 4/5 4/5     SENSATION:  BUE :  BLE :   NEUROLOGICAL SCREEN: (2+ unless otherwise noted.) N=normal  Ab=abnormal   Level Dermatome R L  C3 Anterior Neck  N N  C4 Top of Shoulder N N  C5 Lateral Upper Arm  N N  C6 Lateral Arm/ Thumb  N N  C7 Middle Finger  N  N  C8 4th & 5th Finger N N  T1 Medial Arm N N  L2 Medial thigh/groin N N  L3 Lower thigh/med.knee N N  L4 Medial leg/lat thigh N N  L5 Lat. leg & dorsal foot Ab Ab  S1 post/lat foot/thigh/leg N N  S2 Post./med. thigh & leg N N    SOMATOSENSORY:  Any N & T in extremities or weakness: reports :         Sensation           Intact      Diminished         Absent  Light touch LEs Plantar/dorsal aspect of foot                               SPECIAL TESTS: Hip rule out: FABER - bilateral FAIR - bilateral Scour - bilateral  Pelvic rule out: Compression - Distraction: uncomfortable [place]: positive  Low back: SLR +slight discomfort Prone press up: relieved pain  Nerve glides: improved LLE symptoms   FUNCTIONAL MOBILITY: STS: slight weight shift onto RLE Supine <>prone<>sit ind with extra time due to muscle guarding    GAIT: Patient ambulates without AD with slight antalgic patterning with L weightbearing stance phase. Increased lumbar lordosis with minimal arm swing noted.   OUTCOME MEASURES: TEST Outcome Interpretation  MODI 48% High level of dysfunction   FOTO 46.8%  Predicted discharge score of  64%    Patient educated on proper wearing technique of lumbar brace.     Access Code: [email or web address] URL: [email or web address] Date: 04/30/2020 Prepared by: Janna Arch  Exercises  . Seated Thoracic Lumbar Extension - 1 x daily - 7 x weekly - 2 sets - 10 reps - 5 hold . Standing Lumbar Extension - 1 x daily - 7 x weekly - 2 sets - 10 reps - 5 hold . Prone Press Up On Elbows - 1 x daily - 7 x weekly - 2 sets - 10 reps - 5 hold . Supine Lower  Trunk Rotation - 1 x daily - 7 x weekly - 2 sets - 10 reps - 5 hold . Supine Posterior Pelvic Tilt - 1 x daily - 7 x weekly - 2 sets - 10 reps - 5 hold       Objective measurements completed on examination: See above findings.               PT Education - 04/30/20 1732    Education Details goals, POC, HEP    Person(s) Educated Patient    Methods Explanation;Demonstration;Tactile cues;Verbal cues;Handout    Comprehension Verbalized understanding;Returned demonstration;Verbal cues required;Tactile cues required            PT Short Term Goals - 04/30/20 1736      PT SHORT TERM GOAL #1   Title Patient will be independent in home exercise program to improve strength/mobility for better functional independence with ADLs    Baseline 2/22: HEP given    Time 2    Period Weeks    Status New    Target Date 05/14/20             PT Long Term Goals - 04/30/20 1738      PT LONG TERM GOAL #1   Title Patient will increase FOTO score to equal to or greater than  64%   to demonstrate statistically significant improvement in mobility and quality of life.    Baseline  2/2: 46.8%    Time 8    Period Weeks    Status New    Target Date 06/25/20      PT LONG TERM GOAL #2   Title Patient will reduce modified Oswestry score to <20 as to demonstrate minimal disability with ADLs including improved sleeping tolerance, walking/sitting tolerance etc for better mobility with ADLs.    Baseline 2/2: 48%    Time 8    Period Weeks    Status New    Target Date 06/25/20      PT LONG TERM GOAL #3   Title Patient will report a worst pain of 3/10 on VAS in  low back to improve tolerance with ADLs and reduced symptoms with activities.    Baseline 2/2: 8/10    Time 8    Period Weeks    Status New    Target Date 06/25/20      PT LONG TERM GOAL #4   Title Patient will return to running on all surfaces as well as incline/declines without exacerbation of symptoms to return to PLOF.     Baseline 2/2: unable to walk inclines or run    Time 8    Period Weeks    Status New    Target Date 06/25/20                  Plan - 04/30/20 1733    Clinical Impression Statement Patient is a very pleasant 55 year old woman who presents to physical therapy for back pain. Upon examination patient is found to be hypomobile in lumbar spine with repeated UPA mobilizations reducing pain however L4 CPA increasing pain. Patient favors extension for pain reduction and given home program for pain control with focus on extension. Patient has high level of muscle guarding in lumbar paraspinals and surrounding musculature resulting in higher pain levels with movement and will be an area to target. Patient will benefit from skilled physical therapy to decrease pain, improve posture, and return patient to PLOF.    Personal Factors and Comorbidities Comorbidity 3+;Profession;Past/Current Experience;Time since onset of injury/illness/exacerbation    Comorbidities migraines, pre hypertension, shift work sleep disorder, DDD, hyperprolactinemia    Examination-Activity Limitations Bathing;Bend;Carry;Dressing;Stairs;Squat;Sleep;Sit;Reach Overhead;Locomotion Level;Lift;Stand;Toileting;Transfers    Examination-Participation Restrictions Cleaning;Community Activity;Driving;Laundry;Shop;Occupation;Meal Prep;Volunteer;Yard Work    Merchant navy officer Evolving/Moderate complexity    Clinical Decision Making Moderate    Rehab Potential Fair    PT Frequency 2x / week    PT Duration 8 weeks    PT Treatment/Interventions ADLs/Self Care Home Management;Aquatic Therapy;Biofeedback;Cryotherapy;Electrical Stimulation;Iontophoresis 4mg /ml Dexamethasone;Moist Heat;Traction;Ultrasound;Therapeutic exercise;Therapeutic activities;Functional mobility training;Stair training;Gait training;Balance training;Neuromuscular re-education;Patient/family education;Orthotic Fit/Training;Manual techniques;Passive range of  motion;Dry needling;Energy conservation;Taping;Spinal Manipulations    PT Next Visit Plan distraction of LEs, STM for pain reduction, core activation    PT Home Exercise Plan see above    Consulted and Agree with Plan of Care Patient           Patient will benefit from skilled therapeutic intervention in order to improve the following deficits and impairments:  Decreased activity tolerance,Decreased mobility,Decreased range of motion,Difficulty walking,Decreased strength,Hypomobility,Impaired flexibility,Increased muscle spasms,Impaired perceived functional ability,Impaired sensation,Postural dysfunction,Improper body mechanics,Pain  Visit Diagnosis: Acute low back pain, unspecified back pain laterality, unspecified whether sciatica present  Abnormal posture     Problem List There are no problems to display for this patient.  Janna Arch, PT, DPT   04/30/2020, 5:45 PM  [place] MAIN [facility] SERVICES [place], [place],  [place] Phone: [phone]   Fax:  [phone]  Name: Terron Flesner MRN: [id number] Date of Birth: 11/14/1965

## 2020-04-30 NOTE — Patient Instructions (Signed)
     Access Code: [email or web address] URL: [email or web address] Date: 04/30/2020 Prepared by: Janna Arch  Exercises  . Seated Thoracic Lumbar Extension - 1 x daily - 7 x weekly - 2 sets - 10 reps - 5 hold . Standing Lumbar Extension - 1 x daily - 7 x weekly - 2 sets - 10 reps - 5 hold . Prone Press Up On Elbows - 1 x daily - 7 x weekly - 2 sets - 10 reps - 5 hold . Supine Lower Trunk Rotation - 1 x daily - 7 x weekly - 2 sets - 10 reps - 5 hold . Supine Posterior Pelvic Tilt - 1 x daily - 7 x weekly - 2 sets - 10 reps - 5 hold

## 2020-05-05 ENCOUNTER — Ambulatory Visit: Payer: 59

## 2020-05-05 ENCOUNTER — Other Ambulatory Visit: Payer: Self-pay

## 2020-05-05 DIAGNOSIS — M545 Low back pain, unspecified: Secondary | ICD-10-CM

## 2020-05-05 DIAGNOSIS — R293 Abnormal posture: Secondary | ICD-10-CM | POA: Diagnosis not present

## 2020-05-05 NOTE — Therapy (Signed)
[place] MAIN [facility] SERVICES [address] [place], [place], [address] Phone: [phone]   Fax:  [phone]  Physical Therapy Treatment  Patient Details  Name: Kathleen Diaz MRN: [id number] Date of Birth: 11/02/1965 Referring Provider (PT): Meade Maw   Encounter Date: 05/05/2020   PT End of Session - 05/05/20 0934    Visit Number 2    Number of Visits 16    Date for PT Re-Evaluation 06/25/20    Authorization Type 2/10 eval 04/30/20    PT Start Time 0715    PT Stop Time 0759    PT Time Calculation (min) 44 min    Activity Tolerance Patient tolerated treatment well    Behavior During Therapy [facility] for tasks assessed/performed           Past Medical History:  Diagnosis Date  . Chronic bilateral low back pain with left-sided sciatica   . DDD (degenerative disc disease), lumbar   . External hemorrhoids   . Hyperprolactinemia ([place])   . Intervertebral disc disorder with radiculopathy of lumbar region   . Migraine with aura and without status migrainosus   . Migraines   . New daily persistent headache   . Paresthesia of hand, bilateral   . Paresthesia of left foot   . Prehypertension   . S/P selective transsphenoidal pituitary adenomectomy   . Shift work sleep disorder     Past Surgical History:  Procedure Laterality Date  . BREAST EXCISIONAL BIOPSY    . COLONOSCOPY WITH PROPOFOL N/A 03/17/2020   Procedure: COLONOSCOPY WITH PROPOFOL;  Surgeon: Toledo, [name], MD;  Location: [facility] ENDOSCOPY;  Service: Gastroenterology;  Laterality: N/A;  . ESOPHAGOGASTRODUODENOSCOPY (EGD) WITH PROPOFOL N/A 03/17/2020   Procedure: ESOPHAGOGASTRODUODENOSCOPY (EGD) WITH PROPOFOL;  Surgeon: Toledo, [name], MD;  Location: [facility] ENDOSCOPY;  Service: Gastroenterology;  Laterality: N/A;  . PITUITARY SURGERY      There were no vitals filed for this visit.   Subjective Assessment - 05/05/20 0930    Subjective Patient presents to physical therapy  after completing a night shift. Has been compliant with HEP and noticed an improvement.    Pertinent History Patient presents for low back pain, left SI joint pain, numbness in bottom of right foot. Symptoms began October 3rd 2021. She had her right foot elevated and placed it down feeling something in her left lower back. History of sciatic pain intermittently for 10 years. Made worse with bending forward, exercise, lifting, and sitting.  MRI  showed small left sided disc protrusion L4-5 with Left L5 nerve root impingement. Patient had an injection 03/05/20, didn't benefit until 5-6 weeks later. 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 Wants to return to running, be able to do hills, and have less pain.    Limitations Sitting;Lifting;Standing;Walking;House hold activities;Other (comment)   hills and stairs   How long can you sit comfortably? painful with sitting, especially when knees are at 90 degrees or less    How long can you stand comfortably? painful, requires use of brace at work.    How long can you walk comfortably? requires use of brace at work    Diagnostic tests MRI: small left sided disc protrusion L4-5 with Left L5 nerve root impingement.    Patient Stated Goals to return to walking, improve strength,    Currently in Pain? Yes    Pain Score 2     Pain Location Back    Pain Orientation Lower  Pain Descriptors / Indicators Aching    Pain Type Acute pain    Pain Onset More than a month ago    Pain Frequency Intermittent              Manual: Supine: SAD with belt inferior glide 3x 30 second hold SAD with belt in figure four position 3x30 second hold  Prone: STM to lumbar paraspinals with focus on musculature surrounding L lumbar and sacrum region x 9 minutes with implementation of effleurage and ptrissage.  Grade II CPA and UPA lumbar paraspinals 2x 5 second holds each LE Inferior sacrum mobilization grade II 2x 15 second  holds Prone press up with UPA at L4, L5, 10x 8 second pulse  TherEx Posterior pelvic tilts cues for activation of muscle sequencing TrA contraction pressing into green swiss ball with UE's and LE's ; max cueing for sequencing due to patient preference for excessive muscle recruitment initially. 20x 3 second holds       Pt educated throughout session about proper posture and technique with exercises. Improved exercise technique, movement at target joints, use of target muscles after min to mod verbal, visual, tactile cues   Patient is motivated throughout physical therapy session and tolerated both manual and therex. Education on reason for each intervention chosen performed with patient demonstrating understanding. Patient reports feeling "looser" by end of session. Patient will benefit from skilled physical therapy to decrease pain, improve posture, and return patient to PLOF                      PT Education - 05/05/20 0932    Education Details exercise technique, manual    Person(s) Educated Patient    Methods Explanation;Demonstration;Verbal cues;Tactile cues    Comprehension Verbalized understanding;Returned demonstration;Verbal cues required;Tactile cues required            PT Short Term Goals - 04/30/20 1736      PT SHORT TERM GOAL #1   Title Patient will be independent in home exercise program to improve strength/mobility for better functional independence with ADLs    Baseline 2/22: HEP given    Time 2    Period Weeks    Status New    Target Date 05/14/20             PT Long Term Goals - 04/30/20 1738      PT LONG TERM GOAL #1   Title Patient will increase FOTO score to equal to or greater than  64%   to demonstrate statistically significant improvement in mobility and quality of life.    Baseline 2/2: 46.8%    Time 8    Period Weeks    Status New    Target Date 06/25/20      PT LONG TERM GOAL #2   Title Patient will reduce modified  Oswestry score to <20 as to demonstrate minimal disability with ADLs including improved sleeping tolerance, walking/sitting tolerance etc for better mobility with ADLs.    Baseline 2/2: 48%    Time 8    Period Weeks    Status New    Target Date 06/25/20      PT LONG TERM GOAL #3   Title Patient will report a worst pain of 3/10 on VAS in  low back to improve tolerance with ADLs and reduced symptoms with activities.    Baseline 2/2: 8/10    Time 8    Period Weeks    Status New    Target  Date 06/25/20      PT LONG TERM GOAL #4   Title Patient will return to running on all surfaces as well as incline/declines without exacerbation of symptoms to return to PLOF.    Baseline 2/2: unable to walk inclines or run    Time 8    Period Weeks    Status New    Target Date 06/25/20                 Plan - 05/05/20 0936    Clinical Impression Statement Patient is motivated throughout physical therapy session and tolerated both manual and therex. Education on reason for each intervention chosen performed with patient demonstrating understanding. Patient reports feeling "looser" by end of session. Patient will benefit from skilled physical therapy to decrease pain, improve posture, and return patient to PLOF    Personal Factors and Comorbidities Comorbidity 3+;Profession;Past/Current Experience;Time since onset of injury/illness/exacerbation    Comorbidities migraines, pre hypertension, shift work sleep disorder, DDD, hyperprolactinemia    Examination-Activity Limitations Bathing;Bend;Carry;Dressing;Stairs;Squat;Sleep;Sit;Reach Overhead;Locomotion Level;Lift;Stand;Toileting;Transfers    Examination-Participation Restrictions Cleaning;Community Activity;Driving;Laundry;Shop;Occupation;Meal Prep;Volunteer;Yard Work    Merchant navy officer Evolving/Moderate complexity    Rehab Potential Fair    PT Frequency 2x / week    PT Duration 8 weeks    PT Treatment/Interventions ADLs/Self Care  Home Management;Aquatic Therapy;Biofeedback;Cryotherapy;Electrical Stimulation;Iontophoresis 4mg /ml Dexamethasone;Moist Heat;Traction;Ultrasound;Therapeutic exercise;Therapeutic activities;Functional mobility training;Stair training;Gait training;Balance training;Neuromuscular re-education;Patient/family education;Orthotic Fit/Training;Manual techniques;Passive range of motion;Dry needling;Energy conservation;Taping;Spinal Manipulations    PT Next Visit Plan distraction of LEs, STM for pain reduction, core activation    PT Home Exercise Plan see above    Consulted and Agree with Plan of Care Patient           Patient will benefit from skilled therapeutic intervention in order to improve the following deficits and impairments:  Decreased activity tolerance,Decreased mobility,Decreased range of motion,Difficulty walking,Decreased strength,Hypomobility,Impaired flexibility,Increased muscle spasms,Impaired perceived functional ability,Impaired sensation,Postural dysfunction,Improper body mechanics,Pain  Visit Diagnosis: Acute low back pain, unspecified back pain laterality, unspecified whether sciatica present     Problem List There are no problems to display for this patient.  Janna Arch, PT, DPT    05/05/2020, 9:38 AM  [place] MAIN [facility] SERVICES [address] [place], [place], [address] Phone: [phone]   Fax:  [phone]  Name: Kathleen Diaz MRN: [id number] Date of Birth: [birth year]

## 2020-05-06 ENCOUNTER — Other Ambulatory Visit: Payer: Self-pay

## 2020-05-06 ENCOUNTER — Ambulatory Visit: Payer: 59

## 2020-05-06 DIAGNOSIS — M545 Low back pain, unspecified: Secondary | ICD-10-CM | POA: Diagnosis not present

## 2020-05-06 DIAGNOSIS — R293 Abnormal posture: Secondary | ICD-10-CM

## 2020-05-06 NOTE — Therapy (Signed)
[place] MAIN [facility] SERVICES [address] [place], [place], [address] Phone: [phone]   Fax:  [phone]  Physical Therapy Treatment  Patient Details  Name: Kathleen Diaz MRN: [id number] Date of Birth: [birth year] Referring Provider (PT): Meade Maw   Encounter Date: 05/06/2020   PT End of Session - 05/06/20 0813    Visit Number 3    Number of Visits 16    Date for PT Re-Evaluation 06/25/20    Authorization Type 3/10 eval 04/30/20    PT Start Time 0715    PT Stop Time 0758    PT Time Calculation (min) 43 min    Activity Tolerance Patient tolerated treatment well    Behavior During Therapy [facility] for tasks assessed/performed           Past Medical History:  Diagnosis Date  . Chronic bilateral low back pain with left-sided sciatica   . DDD (degenerative disc disease), lumbar   . External hemorrhoids   . Hyperprolactinemia ([place])   . Intervertebral disc disorder with radiculopathy of lumbar region   . Migraine with aura and without status migrainosus   . Migraines   . New daily persistent headache   . Paresthesia of hand, bilateral   . Paresthesia of left foot   . Prehypertension   . S/P selective transsphenoidal pituitary adenomectomy   . Shift work sleep disorder     Past Surgical History:  Procedure Laterality Date  . BREAST EXCISIONAL BIOPSY    . COLONOSCOPY WITH PROPOFOL N/A 03/17/2020   Procedure: COLONOSCOPY WITH PROPOFOL;  Surgeon: Toledo, [name], MD;  Location: [facility] ENDOSCOPY;  Service: Gastroenterology;  Laterality: N/A;  . ESOPHAGOGASTRODUODENOSCOPY (EGD) WITH PROPOFOL N/A 03/17/2020   Procedure: ESOPHAGOGASTRODUODENOSCOPY (EGD) WITH PROPOFOL;  Surgeon: Toledo, [name], MD;  Location: [facility] ENDOSCOPY;  Service: Gastroenterology;  Laterality: N/A;  . PITUITARY SURGERY      There were no vitals filed for this visit.   Subjective Assessment - 05/06/20 0805    Subjective Patient reports she worked last night,  is on her last day of work before a conference.    Pertinent History Patient presents for low back pain, left SI joint pain, numbness in bottom of right foot. Symptoms began October 3rd 2021. She had her right foot elevated and placed it down feeling something in her left lower back. History of sciatic pain intermittently for 10 years. Made worse with bending forward, exercise, lifting, and sitting.  MRI  showed small left sided disc protrusion L4-5 with Left L5 nerve root impingement. Patient had an injection 03/05/20, didn't benefit until 5-6 weeks later. 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 Wants to return to running, be able to do hills, and have less pain.    Limitations Sitting;Lifting;Standing;Walking;House hold activities;Other (comment)   hills and stairs   How long can you sit comfortably? painful with sitting, especially when knees are at 90 degrees or less    How long can you stand comfortably? painful, requires use of brace at work.    How long can you walk comfortably? requires use of brace at work    Diagnostic tests MRI: small left sided disc protrusion L4-5 with Left L5 nerve root impingement.    Patient Stated Goals to return to walking, improve strength,    Currently in Pain? Yes    Pain Score 2     Pain Location Back    Pain Orientation Lower    Pain Descriptors /  Indicators Aching    Pain Type Acute pain    Pain Onset More than a month ago    Pain Frequency Intermittent                  Manual: Supine: SAD with belt inferior glide 3x 30 second hold SAD with belt in figure four position 3x30 second hold   Prone: STM to lumbar paraspinals with focus on musculature surrounding L lumbar and sacrum region x 9 minutes with implementation of effleurage and ptrissage.  Grade II CPA and UPA lumbar paraspinals 2x 5 second holds each LE Inferior sacrum mobilization grade II 2x 15 second holds L sacrum AP mobilization grade II  3x15 second holds   TherEx Posterior pelvic tilts cues for activation of muscle sequencing TrA contraction pressing into green swiss ball with UE's and LE's ; max cueing for sequencing due to patient preference for excessive muscle recruitment initially. 10x 3 second holds TrA contraction dead bugs pressing into ball with contralateral arm/leg raises 10x each side, very challenging for patient Cat cow 10x with cues for pelvic tilt and abdominal contraction           Pt educated throughout session about proper posture and technique with exercises. Improved exercise technique, movement at target joints, use of target muscles after min to mod verbal, visual, tactile cues                         PT Education - 05/06/20 0805    Education Details exercise technique, manual    Person(s) Educated Patient    Methods Explanation;Demonstration;Tactile cues;Verbal cues    Comprehension Verbalized understanding;Returned demonstration;Verbal cues required;Tactile cues required            PT Short Term Goals - 04/30/20 1736      PT SHORT TERM GOAL #1   Title Patient will be independent in home exercise program to improve strength/mobility for better functional independence with ADLs    Baseline 2/22: HEP given    Time 2    Period Weeks    Status New    Target Date 05/14/20             PT Long Term Goals - 04/30/20 1738      PT LONG TERM GOAL #1   Title Patient will increase FOTO score to equal to or greater than  64%   to demonstrate statistically significant improvement in mobility and quality of life.    Baseline 2/2: 46.8%    Time 8    Period Weeks    Status New    Target Date 06/25/20      PT LONG TERM GOAL #2   Title Patient will reduce modified Oswestry score to <20 as to demonstrate minimal disability with ADLs including improved sleeping tolerance, walking/sitting tolerance etc for better mobility with ADLs.    Baseline 2/2: 48%    Time 8    Period  Weeks    Status New    Target Date 06/25/20      PT LONG TERM GOAL #3   Title Patient will report a worst pain of 3/10 on VAS in  low back to improve tolerance with ADLs and reduced symptoms with activities.    Baseline 2/2: 8/10    Time 8    Period Weeks    Status New    Target Date 06/25/20      PT LONG TERM GOAL #4   Title Patient  will return to running on all surfaces as well as incline/declines without exacerbation of symptoms to return to PLOF.    Baseline 2/2: unable to walk inclines or run    Time 8    Period Weeks    Status New    Target Date 06/25/20                 Plan - 05/06/20 0855    Clinical Impression Statement Patient is tolerating interventions well with good carryover from previous session. Introduction to more advanced core performed with cues for reduction of over recruitment of musculature and focus on specific muscle activation. Patient will benefit from skilled physical therapy to decrease pain, improve posture, and return patient to PLOF    Personal Factors and Comorbidities Comorbidity 3+;Profession;Past/Current Experience;Time since onset of injury/illness/exacerbation    Comorbidities migraines, pre hypertension, shift work sleep disorder, DDD, hyperprolactinemia    Examination-Activity Limitations Bathing;Bend;Carry;Dressing;Stairs;Squat;Sleep;Sit;Reach Overhead;Locomotion Level;Lift;Stand;Toileting;Transfers    Examination-Participation Restrictions Cleaning;Community Activity;Driving;Laundry;Shop;Occupation;Meal Prep;Volunteer;Yard Work    Merchant [employer] officer Evolving/Moderate complexity    Rehab Potential Fair    PT Frequency 2x / week    PT Duration 8 weeks    PT Treatment/Interventions ADLs/Self Care Home Management;Aquatic Therapy;Biofeedback;Cryotherapy;Electrical Stimulation;Iontophoresis 4mg /ml Dexamethasone;Moist Heat;Traction;Ultrasound;Therapeutic exercise;Therapeutic activities;Functional mobility training;Stair  training;Gait training;Balance training;Neuromuscular re-education;Patient/family education;Orthotic Fit/Training;Manual techniques;Passive range of motion;Dry needling;Energy conservation;Taping;Spinal Manipulations    PT Next Visit Plan distraction of LEs, STM for pain reduction, core activation    PT Home Exercise Plan see above    Consulted and Agree with Plan of Care Patient           Patient will benefit from skilled therapeutic intervention in order to improve the following deficits and impairments:  Decreased activity tolerance,Decreased mobility,Decreased range of motion,Difficulty walking,Decreased strength,Hypomobility,Impaired flexibility,Increased muscle spasms,Impaired perceived functional ability,Impaired sensation,Postural dysfunction,Improper body mechanics,Pain  Visit Diagnosis: Acute low back pain, unspecified back pain laterality, unspecified whether sciatica present  Abnormal posture     Problem List There are no problems to display for this patient.  Janna Arch, PT, DPT   05/06/2020, 8:56 AM  [place] MAIN [facility] SERVICES [address] [place], [place], [address] Phone: [phone]   Fax:  [phone]  Name: Kathleen Diaz MRN: [id number] Date of Birth: [birth year]

## 2020-05-14 ENCOUNTER — Ambulatory Visit: Payer: 59

## 2020-05-14 ENCOUNTER — Other Ambulatory Visit: Payer: Self-pay

## 2020-05-14 DIAGNOSIS — R293 Abnormal posture: Secondary | ICD-10-CM

## 2020-05-14 DIAGNOSIS — M545 Low back pain, unspecified: Secondary | ICD-10-CM | POA: Diagnosis not present

## 2020-05-14 MED FILL — TOPIRAMATE 25 MG TABLET: 25 | 30 days supply | Qty: 90 | Fill #1

## 2020-05-14 NOTE — Therapy (Signed)
[place] MAIN [facility] SERVICES [address] [place], [place], [address] Phone: [phone]   Fax:  [phone]  Physical Therapy Treatment  Patient Details  Name: Kathleen Diaz MRN: [id number] Date of Birth: [birth year] Referring Provider (PT): Meade Maw   Encounter Date: 05/14/2020   PT End of Session - 05/14/20 1252    Visit Number 4    Number of Visits 16    Date for PT Re-Evaluation 06/25/20    Authorization Type 4/10 eval 04/30/20    PT Start Time 1100    PT Stop Time 1144    PT Time Calculation (min) 44 min    Activity Tolerance Patient tolerated treatment well    Behavior During Therapy [facility] for tasks assessed/performed           Past Medical History:  Diagnosis Date  . Chronic bilateral low back pain with left-sided sciatica   . DDD (degenerative disc disease), lumbar   . External hemorrhoids   . Hyperprolactinemia ([place])   . Intervertebral disc disorder with radiculopathy of lumbar region   . Migraine with aura and without status migrainosus   . Migraines   . New daily persistent headache   . Paresthesia of hand, bilateral   . Paresthesia of left foot   . Prehypertension   . S/P selective transsphenoidal pituitary adenomectomy   . Shift work sleep disorder     Past Surgical History:  Procedure Laterality Date  . BREAST EXCISIONAL BIOPSY    . COLONOSCOPY WITH PROPOFOL N/A 03/17/2020   Procedure: COLONOSCOPY WITH PROPOFOL;  Surgeon: Toledo, [name], MD;  Location: [facility];  Service: Gastroenterology;  Laterality: N/A;  . ESOPHAGOGASTRODUODENOSCOPY (EGD) WITH PROPOFOL N/A 03/17/2020   Procedure: ESOPHAGOGASTRODUODENOSCOPY (EGD) WITH PROPOFOL;  Surgeon: Toledo, [name], MD;  Location: [facility];  Service: Gastroenterology;  Laterality: N/A;  . PITUITARY SURGERY      There were no vitals filed for this visit.   Subjective Assessment - 05/14/20 1250    Subjective Patient reports her back has not been  hurting as bed. Went to [employer] today to do a fitness exam without any pain.    Pertinent History Patient presents for low back pain, left SI joint pain, numbness in bottom of right foot. Symptoms began October 3rd 2021. She had her right foot elevated and placed it down feeling something in her left lower back. History of sciatic pain intermittently for 10 years. Made worse with bending forward, exercise, lifting, and sitting.  MRI  showed small left sided disc protrusion L4-5 with Left L5 nerve root impingement. Patient had an injection 03/05/20, didn't benefit until 5-6 weeks later. 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 Wants to return to running, be able to do hills, and have less pain.    Limitations Sitting;Lifting;Standing;Walking;House hold activities;Other (comment)   hills and stairs   How long can you sit comfortably? painful with sitting, especially when knees are at 90 degrees or less    How long can you stand comfortably? painful, requires use of brace at work.    How long can you walk comfortably? requires use of brace at work    Diagnostic tests MRI: small left sided disc protrusion L4-5 with Left L5 nerve root impingement.    Patient Stated Goals to return to walking, improve strength,    Currently in Pain? Yes    Pain Score 1     Pain Location Back    Pain Orientation  Lower    Pain Descriptors / Indicators Aching    Pain Type Acute pain    Pain Onset More than a month ago    Pain Frequency Intermittent               Manual: Supine: SAD with belt inferior glide 3x 30 second hold SAD with belt in figure four position 3x30 second hold Hamstring lengthening with leg on PT shoulder 45 second holds Sciatic nerve glide 20x with leg on PT shoulder    Prone: STM to lumbar paraspinals with focus on musculature surrounding L lumbar and sacrum region x 9 minutes with implementation of effleurage and ptrissage.  Grade II CPA  and UPA lumbar paraspinals 2x 5 second holds each LE Inferior sacrum mobilization grade II 2x 15 second holds L sacrum AP mobilization grade II 3x15 second holds   TherEx Posterior pelvic tilts cues for activation of muscle sequencing TrA contraction pressing into green swiss ball with UE's and LE's ; max cueing for sequencing due to patient preference for excessive muscle recruitment initially. 10x 3 second holds TrA contraction dead bugs pressing into ball with contralateral arm/leg raises 10x each side, very challenging for patient Cat cow 10x with cues for pelvic tilt and abdominal contraction  Quadruped UE raises 10x each UE  Quadruped LE raises 8x each LE, very challenging for patient to maintain core alignment   standing trunk extensions 10x         Pt educated throughout session about proper posture and technique with exercises. Improved exercise technique, movement at target joints, use of target muscles after min to mod verbal, visual, tactile cues     Patient demonstrates excellent activation of core musculature this session allowing progression of interventions. Inferior glides to sacrum allow for pain reduction as well as STM in combination with therex interventions. Patient reports decreased discomfort by end of session. Patient will benefit from skilled physical therapy to decrease pain, improve posture, and return patient to PLOF                   PT Education - 05/14/20 1252    Education Details exercise technique, body mechanics, manual    Person(s) Educated Patient    Methods Explanation;Demonstration;Tactile cues;Verbal cues    Comprehension Verbalized understanding;Verbal cues required;Returned demonstration;Tactile cues required            PT Short Term Goals - 04/30/20 1736      PT SHORT TERM GOAL #1   Title Patient will be independent in home exercise program to improve strength/mobility for better functional independence with ADLs     Baseline 2/22: HEP given    Time 2    Period Weeks    Status New    Target Date 05/14/20             PT Long Term Goals - 04/30/20 1738      PT LONG TERM GOAL #1   Title Patient will increase FOTO score to equal to or greater than  64%   to demonstrate statistically significant improvement in mobility and quality of life.    Baseline 2/2: 46.8%    Time 8    Period Weeks    Status New    Target Date 06/25/20      PT LONG TERM GOAL #2   Title Patient will reduce modified Oswestry score to <20 as to demonstrate minimal disability with ADLs including improved sleeping tolerance, walking/sitting tolerance etc for better mobility with ADLs.  Baseline 2/2: 48%    Time 8    Period Weeks    Status New    Target Date 06/25/20      PT LONG TERM GOAL #3   Title Patient will report a worst pain of 3/10 on VAS in  low back to improve tolerance with ADLs and reduced symptoms with activities.    Baseline 2/2: 8/10    Time 8    Period Weeks    Status New    Target Date 06/25/20      PT LONG TERM GOAL #4   Title Patient will return to running on all surfaces as well as incline/declines without exacerbation of symptoms to return to PLOF.    Baseline 2/2: unable to walk inclines or run    Time 8    Period Weeks    Status New    Target Date 06/25/20                 Plan - 05/14/20 1257    Clinical Impression Statement Patient demonstrates excellent activation of core musculature this session allowing progression of interventions. Inferior glides to sacrum allow for pain reduction as well as STM in combination with therex interventions. Patient reports decreased discomfort by end of session. Patient will benefit from skilled physical therapy to decrease pain, improve posture, and return patient to PLOF    Personal Factors and Comorbidities Comorbidity 3+;Profession;Past/Current Experience;Time since onset of injury/illness/exacerbation    Comorbidities migraines, pre  hypertension, shift work sleep disorder, DDD, hyperprolactinemia    Examination-Activity Limitations Bathing;Bend;Carry;Dressing;Stairs;Squat;Sleep;Sit;Reach Overhead;Locomotion Level;Lift;Stand;Toileting;Transfers    Examination-Participation Restrictions Cleaning;Community Activity;Driving;Laundry;Shop;Occupation;Meal Prep;Volunteer;Yard Work    Merchant [employer] officer Evolving/Moderate complexity    Rehab Potential Fair    PT Frequency 2x / week    PT Duration 8 weeks    PT Treatment/Interventions ADLs/Self Care Home Management;Aquatic Therapy;Biofeedback;Cryotherapy;Electrical Stimulation;Iontophoresis 4mg /ml Dexamethasone;Moist Heat;Traction;Ultrasound;Therapeutic exercise;Therapeutic activities;Functional mobility training;Stair training;Gait training;Balance training;Neuromuscular re-education;Patient/family education;Orthotic Fit/Training;Manual techniques;Passive range of motion;Dry needling;Energy conservation;Taping;Spinal Manipulations    PT Next Visit Plan distraction of LEs, STM for pain reduction, core activation    PT Home Exercise Plan see above    Consulted and Agree with Plan of Care Patient           Patient will benefit from skilled therapeutic intervention in order to improve the following deficits and impairments:  Decreased activity tolerance,Decreased mobility,Decreased range of motion,Difficulty walking,Decreased strength,Hypomobility,Impaired flexibility,Increased muscle spasms,Impaired perceived functional ability,Impaired sensation,Postural dysfunction,Improper body mechanics,Pain  Visit Diagnosis: Acute low back pain, unspecified back pain laterality, unspecified whether sciatica present  Abnormal posture     Problem List There are no problems to display for this patient.  Janna Arch, PT, DPT   05/14/2020, 12:58 PM  [place] MAIN [facility] SERVICES [address] [place], [place], [address] Phone:  [phone]   Fax:  [phone]  Name: Kathleen Diaz MRN: [id number] Date of Birth: [birth year]

## 2020-05-16 MED FILL — GABAPENTIN 300 MG CAPSULE: 300 | 90 days supply | Qty: 270 | Fill #1

## 2020-05-19 ENCOUNTER — Ambulatory Visit: Payer: 59

## 2020-05-21 ENCOUNTER — Other Ambulatory Visit: Payer: Self-pay

## 2020-05-21 ENCOUNTER — Ambulatory Visit: Payer: 59

## 2020-05-21 DIAGNOSIS — M545 Low back pain, unspecified: Secondary | ICD-10-CM | POA: Diagnosis not present

## 2020-05-21 DIAGNOSIS — R293 Abnormal posture: Secondary | ICD-10-CM | POA: Diagnosis not present

## 2020-05-21 NOTE — Therapy (Signed)
[place] MAIN [facility] SERVICES [address] [place], [place], [address] Phone: [phone]   Fax:  [phone]  Physical Therapy Treatment  Patient Details  Name: Kathleen Diaz MRN: [id number] Date of Birth: 07-Nov-1965 Referring Provider (PT): Meade Maw   Encounter Date: 05/21/2020   PT End of Session - 05/21/20 0928    Visit Number 5    Number of Visits 16    Date for PT Re-Evaluation 06/25/20    Authorization Type 5/10 eval 04/30/20    PT Start Time 0714    PT Stop Time 0805    PT Time Calculation (min) 51 min    Activity Tolerance Patient tolerated treatment well    Behavior During Therapy [facility] for tasks assessed/performed           Past Medical History:  Diagnosis Date  . Chronic bilateral low back pain with left-sided sciatica   . DDD (degenerative disc disease), lumbar   . External hemorrhoids   . Hyperprolactinemia ([place])   . Intervertebral disc disorder with radiculopathy of lumbar region   . Migraine with aura and without status migrainosus   . Migraines   . New daily persistent headache   . Paresthesia of hand, bilateral   . Paresthesia of left foot   . Prehypertension   . S/P selective transsphenoidal pituitary adenomectomy   . Shift work sleep disorder     Past Surgical History:  Procedure Laterality Date  . BREAST EXCISIONAL BIOPSY    . COLONOSCOPY WITH PROPOFOL N/A 03/17/2020   Procedure: COLONOSCOPY WITH PROPOFOL;  Surgeon: Toledo, [name], MD;  Location: [facility] ENDOSCOPY;  Service: Gastroenterology;  Laterality: N/A;  . ESOPHAGOGASTRODUODENOSCOPY (EGD) WITH PROPOFOL N/A 03/17/2020   Procedure: ESOPHAGOGASTRODUODENOSCOPY (EGD) WITH PROPOFOL;  Surgeon: Toledo, [name], MD;  Location: [facility] ENDOSCOPY;  Service: Gastroenterology;  Laterality: N/A;  . PITUITARY SURGERY      There were no vitals filed for this visit.   Subjective Assessment - 05/21/20 0923    Subjective Patient reports she went to the gym  twice, accidentally over did it the second time and was overly sore the next day or two. Has been having difficulty sitting for long periods after over doing it.    Pertinent History Patient presents for low back pain, left SI joint pain, numbness in bottom of right foot. Symptoms began October 3rd 2021. She had her right foot elevated and placed it down feeling something in her left lower back. History of sciatic pain intermittently for 10 years. Made worse with bending forward, exercise, lifting, and sitting.  MRI  showed small left sided disc protrusion L4-5 with Left L5 nerve root impingement. Patient had an injection 03/05/20, didn't benefit until 5-6 weeks later. 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 Wants to return to running, be able to do hills, and have less pain.    Limitations Sitting;Lifting;Standing;Walking;House hold activities;Other (comment)   hills and stairs   How long can you sit comfortably? painful with sitting, especially when knees are at 90 degrees or less    How long can you stand comfortably? painful, requires use of brace at work.    How long can you walk comfortably? requires use of brace at work    Diagnostic tests MRI: small left sided disc protrusion L4-5 with Left L5 nerve root impingement.    Patient Stated Goals to return to walking, improve strength,    Currently in Pain? Yes    Pain Score  1     Pain Location Back    Pain Orientation Lower    Pain Descriptors / Indicators Aching    Pain Type Acute pain    Pain Onset More than a month ago    Pain Frequency Intermittent                Manual: Supine: SAD with belt inferior glide 3x 30 second hold SAD with belt in figure four position 3x30 second hold Hamstring lengthening with leg on PT shoulder 45 second holds Sciatic nerve glide 20x with leg on PT shoulder  Piriformis stretch with PT overpressure 30 sec each LE   Prone: STM to lumbar paraspinals with  focus on musculature surrounding L lumbar and sacrum region x 8 minutes with implementation of effleurage and ptrissage.  Grade II CPA and UPA lumbar paraspinals 2x 5 second holds each LE Inferior sacrum mobilization grade II 2x 15 second holds L sacrum AP mobilization grade II 3x15 second holds  Prone press up with grade II CPA to lumbar spine levels L1-5 5-10 second hold each level x 8  TherEx Posterior pelvic tilt pelvic engagement with bolster squeeze between knees 12x 5 second holds  Cat cow 10x with cues for pelvic tilt and abdominal contraction  Birddog with contralateral raises 15x each side (added to HEP) Child pose 20 seconds          Patient educated on return to gym safety for reduction of re-injury. Education on positioning with different machines, rest periods, and gradual increase of intensity met with verbal understanding. Patient will be off next week but will benefit from return the week after for assessment of further need and reduction of risk if re-injury.  Patient will benefit from skilled physical therapy to decrease pain, improve posture, and return patient to PLOF                          PT Education - 05/21/20 0928    Education Details exercise technique, body mechanics, manual, POC.    Person(s) Educated Patient    Methods Explanation;Demonstration;Tactile cues;Verbal cues    Comprehension Verbalized understanding;Returned demonstration;Verbal cues required;Tactile cues required            PT Short Term Goals - 04/30/20 1736      PT SHORT TERM GOAL #1   Title Patient will be independent in home exercise program to improve strength/mobility for better functional independence with ADLs    Baseline 2/22: HEP given    Time 2    Period Weeks    Status New    Target Date 05/14/20             PT Long Term Goals - 04/30/20 1738      PT LONG TERM GOAL #1   Title Patient will increase FOTO score to equal to or greater than  64%    to demonstrate statistically significant improvement in mobility and quality of life.    Baseline 2/2: 46.8%    Time 8    Period Weeks    Status New    Target Date 06/25/20      PT LONG TERM GOAL #2   Title Patient will reduce modified Oswestry score to <20 as to demonstrate minimal disability with ADLs including improved sleeping tolerance, walking/sitting tolerance etc for better mobility with ADLs.    Baseline 2/2: 48%    Time 8    Period Weeks    Status New  Target Date 06/25/20      PT LONG TERM GOAL #3   Title Patient will report a worst pain of 3/10 on VAS in  low back to improve tolerance with ADLs and reduced symptoms with activities.    Baseline 2/2: 8/10    Time 8    Period Weeks    Status New    Target Date 06/25/20      PT LONG TERM GOAL #4   Title Patient will return to running on all surfaces as well as incline/declines without exacerbation of symptoms to return to PLOF.    Baseline 2/2: unable to walk inclines or run    Time 8    Period Weeks    Status New    Target Date 06/25/20                 Plan - 05/21/20 0355    Clinical Impression Statement Patient educated on return to gym safety for reduction of re-injury. Education on positioning with different machines, rest periods, and gradual increase of intensity met with verbal understanding. Patient will be off next week but will benefit from return the week after for assessment of further need and reduction of risk if re-injury.  Patient will benefit from skilled physical therapy to decrease pain, improve posture, and return patient to PLOF    Personal Factors and Comorbidities Comorbidity 3+;Profession;Past/Current Experience;Time since onset of injury/illness/exacerbation    Comorbidities migraines, pre hypertension, shift work sleep disorder, DDD, hyperprolactinemia    Examination-Activity Limitations Bathing;Bend;Carry;Dressing;Stairs;Squat;Sleep;Sit;Reach Overhead;Locomotion  Level;Lift;Stand;Toileting;Transfers    Examination-Participation Restrictions Cleaning;Community Activity;Driving;Laundry;Shop;Occupation;Meal Prep;Volunteer;Yard Work    Merchant [employer] officer Evolving/Moderate complexity    Rehab Potential Fair    PT Frequency 2x / week    PT Duration 8 weeks    PT Treatment/Interventions ADLs/Self Care Home Management;Aquatic Therapy;Biofeedback;Cryotherapy;Electrical Stimulation;Iontophoresis 70m/ml Dexamethasone;Moist Heat;Traction;Ultrasound;Therapeutic exercise;Therapeutic activities;Functional mobility training;Stair training;Gait training;Balance training;Neuromuscular re-education;Patient/family education;Orthotic Fit/Training;Manual techniques;Passive range of motion;Dry needling;Energy conservation;Taping;Spinal Manipulations    PT Next Visit Plan last week of PT, return to gym, reduction of injury prevention    PT Home Exercise Plan see above    Consulted and Agree with Plan of Care Patient           Patient will benefit from skilled therapeutic intervention in order to improve the following deficits and impairments:  Decreased activity tolerance,Decreased mobility,Decreased range of motion,Difficulty walking,Decreased strength,Hypomobility,Impaired flexibility,Increased muscle spasms,Impaired perceived functional ability,Impaired sensation,Postural dysfunction,Improper body mechanics,Pain  Visit Diagnosis: Acute low back pain, unspecified back pain laterality, unspecified whether sciatica present  Abnormal posture     Problem List There are no problems to display for this patient.  MJanna Arch PT, DPT   05/21/2020, 9:39 AM  [place] [facility] [address] [address]Phone: [phone]  Fax:  [phone] Name: HDebara KamphuisMRN: [id number] of Birth: 908-09-1965

## 2020-05-26 DIAGNOSIS — Z01419 Encounter for gynecological examination (general) (routine) without abnormal findings: Secondary | ICD-10-CM | POA: Diagnosis not present

## 2020-05-26 DIAGNOSIS — R87610 Atypical squamous cells of undetermined significance on cytologic smear of cervix (ASC-US): Secondary | ICD-10-CM | POA: Diagnosis not present

## 2020-06-02 ENCOUNTER — Other Ambulatory Visit: Payer: Self-pay

## 2020-06-02 ENCOUNTER — Ambulatory Visit: Payer: 59 | Attending: Neurosurgery

## 2020-06-02 DIAGNOSIS — M545 Low back pain, unspecified: Secondary | ICD-10-CM | POA: Diagnosis not present

## 2020-06-02 DIAGNOSIS — R293 Abnormal posture: Secondary | ICD-10-CM | POA: Insufficient documentation

## 2020-06-02 NOTE — Therapy (Signed)
[place] MAIN [facility] SERVICES [address] [place], [place], [address] Phone: [phone]   Fax:  [phone]  Physical Therapy Treatment  Patient Details  Name: Kathleen Diaz MRN: [id number] Date of Birth: 09-06-1965 Referring Provider (PT): Meade Maw   Encounter Date: 06/02/2020   PT End of Session - 06/02/20 0758    Visit Number 6    Number of Visits 16    Date for PT Re-Evaluation 06/25/20    Authorization Type 6/10 eval 04/30/20    PT Start Time 0713    PT Stop Time 0753    PT Time Calculation (min) 40 min    Activity Tolerance Patient tolerated treatment well    Behavior During Therapy [facility] for tasks assessed/performed           Past Medical History:  Diagnosis Date  . Chronic bilateral low back pain with left-sided sciatica   . DDD (degenerative disc disease), lumbar   . External hemorrhoids   . Hyperprolactinemia ([place])   . Intervertebral disc disorder with radiculopathy of lumbar region   . Migraine with aura and without status migrainosus   . Migraines   . New daily persistent headache   . Paresthesia of hand, bilateral   . Paresthesia of left foot   . Prehypertension   . S/P selective transsphenoidal pituitary adenomectomy   . Shift work sleep disorder     Past Surgical History:  Procedure Laterality Date  . BREAST EXCISIONAL BIOPSY    . COLONOSCOPY WITH PROPOFOL N/A 03/17/2020   Procedure: COLONOSCOPY WITH PROPOFOL;  Surgeon: Toledo, [name], MD;  Location: [facility] ENDOSCOPY;  Service: Gastroenterology;  Laterality: N/A;  . ESOPHAGOGASTRODUODENOSCOPY (EGD) WITH PROPOFOL N/A 03/17/2020   Procedure: ESOPHAGOGASTRODUODENOSCOPY (EGD) WITH PROPOFOL;  Surgeon: Toledo, [name], MD;  Location: [facility] ENDOSCOPY;  Service: Gastroenterology;  Laterality: N/A;  . PITUITARY SURGERY      There were no vitals filed for this visit.   Subjective Assessment - 06/02/20 0757    Subjective Patient has gone to the gym since last  seen. Has had a week without PT and is doing well. Is feeling stiff today but overall has not had pain increase.    Pertinent History Patient presents for low back pain, left SI joint pain, numbness in bottom of right foot. Symptoms began October 3rd 2021. She had her right foot elevated and placed it down feeling something in her left lower back. History of sciatic pain intermittently for 10 years. Made worse with bending forward, exercise, lifting, and sitting.  MRI  showed small left sided disc protrusion L4-5 with Left L5 nerve root impingement. Patient had an injection 03/05/20, didn't benefit until 5-6 weeks later. 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 Wants to return to running, be able to do hills, and have less pain.    Limitations Sitting;Lifting;Standing;Walking;House hold activities;Other (comment)   hills and stairs   How long can you sit comfortably? painful with sitting, especially when knees are at 90 degrees or less    How long can you stand comfortably? painful, requires use of brace at work.    How long can you walk comfortably? requires use of brace at work    Diagnostic tests MRI: small left sided disc protrusion L4-5 with Left L5 nerve root impingement.    Patient Stated Goals to return to walking, improve strength,    Currently in Pain? No/denies  Manual: Supine: Piriformis stretch with PT overpressure 30 sec each LE    Prone: STM to lumbar paraspinals with focus on musculature surrounding L lumbar and sacrum region x 8 minutes with implementation of effleurage and ptrissage.  Grade II CPA and UPA lumbar paraspinals 2x 5 second holds each LE Inferior sacrum mobilization grade II 2x 15 second holds L sacrum AP mobilization grade II 3x15 second holds  Prone press up with grade II CPA to lumbar spine levels L1-5 5-10 second hold each level x 8   TherEx Posterior pelvic tilt pelvic engagement with bolster  squeeze between knees 12x 5 second holds  Green swiss ball TrA contraction 10x 3 second hold Contralateral UE/LE raises with TrA contraction 10x each LE/UE        Access Code: [email or web address] URL: [email or web address] Date: 06/02/2020 Prepared by: Janna Arch  Exercises  . Supine Figure 4 Piriformis Stretch - 1 x daily - 7 x weekly - 2 sets - 10 reps - 5 hold . Supine Hip Adduction Isometric with Ball - 1 x daily - 7 x weekly - 2 sets - 10 reps - 5 hold . Supine Transversus Abdominis Bracing - Hands on Stomach - 1 x daily - 7 x weekly - 2 sets - 10 reps - 5 hold . Bird Dog - 1 x daily - 7 x weekly - 2 sets - 10 reps - 5 hold . Prone Press Up - 1 x daily - 7 x weekly - 2 sets - 10 reps - 5 hold Standing Thoracic Extension at [place] - 1 x daily - 7 x weekly - 2 sets - 10 reps - 5 hold    Patient performed with instruction, verbal cues, tactile cues of therapist: goal: increase tissue extensibility, promote proper posture, improve mobility   Patient educated and given home program. Is aware next session may be last session as she has progressed to primarily self care at this time. She continues to remain highly motivated and is able to comprehend progressions and anatomical reasoning behind sequencing. Core activation is progressing with decreased compensatory recruitment patterning noted. Patient will benefit from skilled physical therapy to decrease pain, improve posture, and return patient to PLOF                  PT Education - 06/02/20 0758    Education Details exercise technique, HEP , POC    Person(s) Educated Patient    Methods Explanation;Demonstration;Tactile cues;Verbal cues;Handout    Comprehension Verbalized understanding;Returned demonstration;Verbal cues required;Tactile cues required            PT Short Term Goals - 04/30/20 1736      PT SHORT TERM GOAL #1   Title Patient will be independent in home exercise program to improve  strength/mobility for better functional independence with ADLs    Baseline 2/22: HEP given    Time 2    Period Weeks    Status New    Target Date 05/14/20             PT Long Term Goals - 04/30/20 1738      PT LONG TERM GOAL #1   Title Patient will increase FOTO score to equal to or greater than  64%   to demonstrate statistically significant improvement in mobility and quality of life.    Baseline 2/2: 46.8%    Time 8    Period Weeks    Status New    Target Date 06/25/20  PT LONG TERM GOAL #2   Title Patient will reduce modified Oswestry score to <20 as to demonstrate minimal disability with ADLs including improved sleeping tolerance, walking/sitting tolerance etc for better mobility with ADLs.    Baseline 2/2: 48%    Time 8    Period Weeks    Status New    Target Date 06/25/20      PT LONG TERM GOAL #3   Title Patient will report a worst pain of 3/10 on VAS in  low back to improve tolerance with ADLs and reduced symptoms with activities.    Baseline 2/2: 8/10    Time 8    Period Weeks    Status New    Target Date 06/25/20      PT LONG TERM GOAL #4   Title Patient will return to running on all surfaces as well as incline/declines without exacerbation of symptoms to return to PLOF.    Baseline 2/2: unable to walk inclines or run    Time 8    Period Weeks    Status New    Target Date 06/25/20                 Plan - 06/02/20 0759    Clinical Impression Statement Patient educated and given home program. Is aware next session may be last session as she has progressed to primarily self care at this time. She continues to remain highly motivated and is able to comprehend progressions and anatomical reasoning behind sequencing. Core activation is progressing with decreased compensatory recruitment patterning noted. Patient will benefit from skilled physical therapy to decrease pain, improve posture, and return patient to PLOF    Personal Factors and Comorbidities  Comorbidity 3+;Profession;Past/Current Experience;Time since onset of injury/illness/exacerbation    Comorbidities migraines, pre hypertension, shift work sleep disorder, DDD, hyperprolactinemia    Examination-Activity Limitations Bathing;Bend;Carry;Dressing;Stairs;Squat;Sleep;Sit;Reach Overhead;Locomotion Level;Lift;Stand;Toileting;Transfers    Examination-Participation Restrictions Cleaning;Community Activity;Driving;Laundry;Shop;Occupation;Meal Prep;Volunteer;Yard Work    Merchant [employer] officer Evolving/Moderate complexity    Rehab Potential Fair    PT Frequency 2x / week    PT Duration 8 weeks    PT Treatment/Interventions ADLs/Self Care Home Management;Aquatic Therapy;Biofeedback;Cryotherapy;Electrical Stimulation;Iontophoresis 4mg /ml Dexamethasone;Moist Heat;Traction;Ultrasound;Therapeutic exercise;Therapeutic activities;Functional mobility training;Stair training;Gait training;Balance training;Neuromuscular re-education;Patient/family education;Orthotic Fit/Training;Manual techniques;Passive range of motion;Dry needling;Energy conservation;Taping;Spinal Manipulations    PT Next Visit Plan last week of PT, return to gym, reduction of injury prevention    PT Home Exercise Plan see above    Consulted and Agree with Plan of Care Patient           Patient will benefit from skilled therapeutic intervention in order to improve the following deficits and impairments:  Decreased activity tolerance,Decreased mobility,Decreased range of motion,Difficulty walking,Decreased strength,Hypomobility,Impaired flexibility,Increased muscle spasms,Impaired perceived functional ability,Impaired sensation,Postural dysfunction,Improper body mechanics,Pain  Visit Diagnosis: Acute low back pain, unspecified back pain laterality, unspecified whether sciatica present  Abnormal posture     Problem List There are no problems to display for this patient.  Janna Arch, PT, DPT   06/02/2020, 7:59  AM  [place] MAIN [facility] SERVICES [address] [place], [place], [address] Phone: [phone]   Fax:  [phone]  Name: Kathleen Diaz MRN: [id number] Date of Birth: 1966/01/08

## 2020-06-04 ENCOUNTER — Other Ambulatory Visit: Payer: Self-pay

## 2020-06-04 ENCOUNTER — Ambulatory Visit: Payer: 59

## 2020-06-04 ENCOUNTER — Other Ambulatory Visit: Payer: Self-pay | Admitting: Obstetrics and Gynecology

## 2020-06-04 DIAGNOSIS — Z1231 Encounter for screening mammogram for malignant neoplasm of breast: Secondary | ICD-10-CM

## 2020-06-04 DIAGNOSIS — R293 Abnormal posture: Secondary | ICD-10-CM

## 2020-06-04 DIAGNOSIS — M545 Low back pain, unspecified: Secondary | ICD-10-CM

## 2020-06-04 NOTE — Therapy (Signed)
[place] MAIN [facility] SERVICES [address] [place], [place], [address] Phone: [phone]   Fax:  [phone]  Physical Therapy Treatment/DISCHARGE  Patient Details  Name: Kathleen Diaz MRN: [id number] Date of Birth: 1965-09-19 Referring Provider (PT): Meade Maw   Encounter Date: 06/04/2020   PT End of Session - 06/04/20 0727    Visit Number 7    Number of Visits 16    Date for PT Re-Evaluation 06/25/20    Authorization Type 7/10 eval 04/30/20    PT Start Time 0715    PT Stop Time 0759    PT Time Calculation (min) 44 min    Activity Tolerance Patient tolerated treatment well    Behavior During Therapy [facility] for tasks assessed/performed           Past Medical History:  Diagnosis Date  . Chronic bilateral low back pain with left-sided sciatica   . DDD (degenerative disc disease), lumbar   . External hemorrhoids   . Hyperprolactinemia ([place])   . Intervertebral disc disorder with radiculopathy of lumbar region   . Migraine with aura and without status migrainosus   . Migraines   . New daily persistent headache   . Paresthesia of hand, bilateral   . Paresthesia of left foot   . Prehypertension   . S/P selective transsphenoidal pituitary adenomectomy   . Shift work sleep disorder     Past Surgical History:  Procedure Laterality Date  . BREAST EXCISIONAL BIOPSY    . COLONOSCOPY WITH PROPOFOL N/A 03/17/2020   Procedure: COLONOSCOPY WITH PROPOFOL;  Surgeon: Toledo, [name], MD;  Location: [facility] ENDOSCOPY;  Service: Gastroenterology;  Laterality: N/A;  . ESOPHAGOGASTRODUODENOSCOPY (EGD) WITH PROPOFOL N/A 03/17/2020   Procedure: ESOPHAGOGASTRODUODENOSCOPY (EGD) WITH PROPOFOL;  Surgeon: Toledo, [name], MD;  Location: [facility] ENDOSCOPY;  Service: Gastroenterology;  Laterality: N/A;  . PITUITARY SURGERY      There were no vitals filed for this visit.   Subjective Assessment - 06/04/20 0725    Subjective Patient is noticing a return  to previous activities. Is finding herself forgetting her brace more frequently and feeling stiff. Is nervous about today being last session but does know she has improved.    Pertinent History Patient presents for low back pain, left SI joint pain, numbness in bottom of right foot. Symptoms began October 3rd 2021. She had her right foot elevated and placed it down feeling something in her left lower back. History of sciatic pain intermittently for 10 years. Made worse with bending forward, exercise, lifting, and sitting.  MRI  showed small left sided disc protrusion L4-5 with Left L5 nerve root impingement. Patient had an injection 03/05/20, didn't benefit until 5-6 weeks later. 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 Wants to return to running, be able to do hills, and have less pain.    Limitations Sitting;Lifting;Standing;Walking;House hold activities;Other (comment)   hills and stairs   How long can you sit comfortably? painful with sitting, especially when knees are at 90 degrees or less    How long can you stand comfortably? painful, requires use of brace at work.    How long can you walk comfortably? requires use of brace at work    Diagnostic tests MRI: small left sided disc protrusion L4-5 with Left L5 nerve root impingement.    Patient Stated Goals to return to walking, improve strength,    Currently in Pain? No/denies  FOTO: 57%  MODI: 19%  VAS: 2-3/10 Return to running      Manual: Supine: Piriformis stretch with PT overpressure 30 sec each LE    Prone: STM to lumbar paraspinals with focus on musculature surrounding L lumbar and sacrum region x 8 minutes with implementation of effleurage and ptrissage.  Grade II CPA and UPA lumbar paraspinals 2x 5 second holds each LE Inferior sacrum mobilization grade II 2x 15 second holds L sacrum AP mobilization grade II 3x15 second holds  Prone press up with grade II CPA to  lumbar spine levels L1-5 5-10 second hold each level x 8       Today is patient's last session, patient has made significant gains towards goals at this time. Patient educated on need to maintain compliance with HEP and core stabilization for reduction of risk of return of back pain. Patient agreeable to plan. I will be happy to see this patient again in the future as needed.                   PT Education - 06/04/20 0726    Education Details discharge, POC    Person(s) Educated Patient    Methods Explanation    Comprehension Verbalized understanding            PT Short Term Goals - 06/04/20 0733      PT SHORT TERM GOAL #1   Title Patient will be independent in home exercise program to improve strength/mobility for better functional independence with ADLs    Baseline 2/22: HEP given 3/9: HEP compliant    Time 2    Period Weeks    Status Achieved    Target Date 05/14/20             PT Long Term Goals - 06/04/20 0733      PT LONG TERM GOAL #1   Title Patient will increase FOTO score to equal to or greater than  64%   to demonstrate statistically significant improvement in mobility and quality of life.    Baseline 2/2: 46.8% 3/9: 57%    Time 8    Period Weeks    Status Partially Met      PT LONG TERM GOAL #2   Title Patient will reduce modified Oswestry score to <20 as to demonstrate minimal disability with ADLs including improved sleeping tolerance, walking/sitting tolerance etc for better mobility with ADLs.    Baseline 2/2: 48% 3/9: 19%    Time 8    Period Weeks    Status Achieved      PT LONG TERM GOAL #3   Title Patient will report a worst pain of 3/10 on VAS in  low back to improve tolerance with ADLs and reduced symptoms with activities.    Baseline 2/2: 8/10 3/9: 2-3/10    Time 8    Period Weeks    Status Achieved      PT LONG TERM GOAL #4   Title Patient will return to running on all surfaces as well as incline/declines without exacerbation  of symptoms to return to PLOF.    Baseline 2/2: unable to walk inclines or run 3/9: able to run on machine, not able to run fully on cement    Time 8    Period Weeks    Status Partially Met                 Plan - 06/04/20 2956    Clinical Impression Statement Today is  patient's last session, patient has made significant gains towards goals at this time. Patient educated on need to maintain compliance with HEP and core stabilization for reduction of risk of return of back pain. Patient agreeable to plan. I will be happy to see this patient again in the future as needed.    Personal Factors and Comorbidities Comorbidity 3+;Profession;Past/Current Experience;Time since onset of injury/illness/exacerbation    Comorbidities migraines, pre hypertension, shift work sleep disorder, DDD, hyperprolactinemia    Examination-Activity Limitations Bathing;Bend;Carry;Dressing;Stairs;Squat;Sleep;Sit;Reach Overhead;Locomotion Level;Lift;Stand;Toileting;Transfers    Examination-Participation Restrictions Cleaning;Community Activity;Driving;Laundry;Shop;Occupation;Meal Prep;Volunteer;Yard Work    [employer]chant [employer] officer Evolving/Moderate complexity    Rehab Potential Fair    PT Frequency 2x / week    PT Duration 8 weeks    PT Treatment/Interventions ADLs/Self Care Home Management;Aquatic Therapy;Biofeedback;Cryotherapy;Electrical Stimulation;Iontophoresis 7m/ml Dexamethasone;Moist Heat;Traction;Ultrasound;Therapeutic exercise;Therapeutic activities;Functional mobility training;Stair training;Gait training;Balance training;Neuromuscular re-education;Patient/family education;Orthotic Fit/Training;Manual techniques;Passive range of motion;Dry needling;Energy conservation;Taping;Spinal Manipulations    PT Next Visit Plan last week of PT, return to gym, reduction of injury prevention    PT Home Exercise Plan see above    Consulted and Agree with Plan of Care Patient           Patient will  benefit from skilled therapeutic intervention in order to improve the following deficits and impairments:  Decreased activity tolerance,Decreased mobility,Decreased range of motion,Difficulty walking,Decreased strength,Hypomobility,Impaired flexibility,Increased muscle spasms,Impaired perceived functional ability,Impaired sensation,Postural dysfunction,Improper body mechanics,Pain  Visit Diagnosis: Acute low back pain, unspecified back pain laterality, unspecified whether sciatica present  Abnormal posture     Problem List There are no problems to display for this patient.  MJanna Arch PT, DPT   06/04/2020, 8:34 AM  [place] [facility] [address] [address]Phone: [phone]  Fax:  [phone] Name: HMaisha BogenMRN: [id number] of Birth: [birth year]

## 2020-06-17 ENCOUNTER — Other Ambulatory Visit (HOSPITAL_BASED_OUTPATIENT_CLINIC_OR_DEPARTMENT_OTHER): Payer: Self-pay

## 2020-07-18 ENCOUNTER — Other Ambulatory Visit (HOSPITAL_COMMUNITY): Payer: Self-pay

## 2020-07-18 MED FILL — Cyclobenzaprine HCl Tab 10 MG: ORAL | 30 days supply | Qty: 90 | Fill #0 | Status: AC

## 2020-07-21 ENCOUNTER — Other Ambulatory Visit (HOSPITAL_COMMUNITY): Payer: Self-pay

## 2020-07-23 ENCOUNTER — Other Ambulatory Visit (HOSPITAL_COMMUNITY): Payer: Self-pay

## 2020-07-24 ENCOUNTER — Other Ambulatory Visit (HOSPITAL_COMMUNITY): Payer: Self-pay

## 2020-07-24 MED ORDER — ESZOPICLONE 2 MG PO TABS
ORAL_TABLET | ORAL | 0 refills | Status: DC
  Filled 2020-07-24: qty 90, 90d supply, fill #0

## 2020-07-24 MED ORDER — SUMATRIPTAN SUCCINATE 100 MG PO TABS
ORAL_TABLET | ORAL | 0 refills | Status: DC
  Filled 2020-07-24: qty 27, 90d supply, fill #0

## 2020-07-29 DIAGNOSIS — D352 Benign neoplasm of pituitary gland: Secondary | ICD-10-CM | POA: Diagnosis not present

## 2020-07-30 DIAGNOSIS — R7611 Nonspecific reaction to tuberculin skin test without active tuberculosis: Secondary | ICD-10-CM | POA: Diagnosis not present

## 2020-08-02 MED FILL — Topiramate Tab 25 MG: ORAL | 30 days supply | Qty: 90 | Fill #0 | Status: CN

## 2020-08-04 ENCOUNTER — Other Ambulatory Visit (HOSPITAL_COMMUNITY): Payer: Self-pay

## 2020-08-12 ENCOUNTER — Other Ambulatory Visit (HOSPITAL_COMMUNITY): Payer: Self-pay

## 2020-08-13 ENCOUNTER — Other Ambulatory Visit: Payer: Self-pay

## 2020-08-13 ENCOUNTER — Ambulatory Visit
Admission: RE | Admit: 2020-08-13 | Discharge: 2020-08-13 | Disposition: A | Discharge status: Home / Self Care | Payer: 59 | Source: Ambulatory Visit | Attending: Obstetrics and Gynecology | Admitting: Obstetrics and Gynecology

## 2020-08-13 DIAGNOSIS — Z1231 Encounter for screening mammogram for malignant neoplasm of breast: Secondary | ICD-10-CM | POA: Insufficient documentation

## 2020-08-13 IMAGING — MG MM DIGITAL SCREENING BILAT W/ TOMO AND CAD
8 series · 8 of 24 positions shown · non-contrast
Comparison: Previous exam(s).

CLINICAL DATA: Screening.

EXAM:
DIGITAL SCREENING BILATERAL MAMMOGRAM WITH TOMOSYNTHESIS AND CAD
TECHNIQUE: Bilateral screening digital craniocaudal and mediolateral oblique
mammograms were obtained. Bilateral screening digital breast
tomosynthesis was performed. The images were evaluated with
computer-aided detection.

[R CC synth-2D]
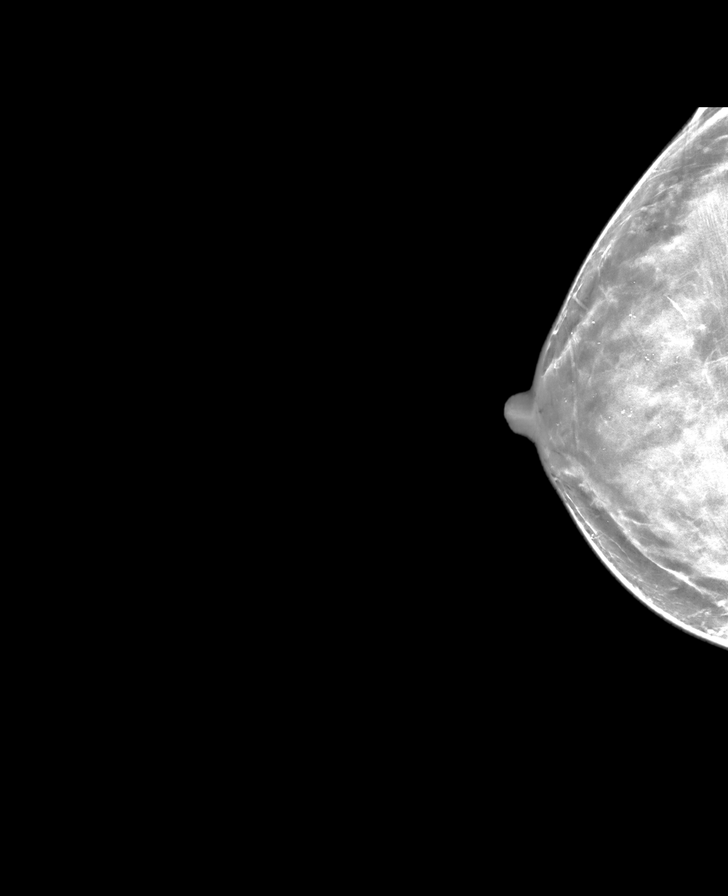

[R MLO synth-2D]
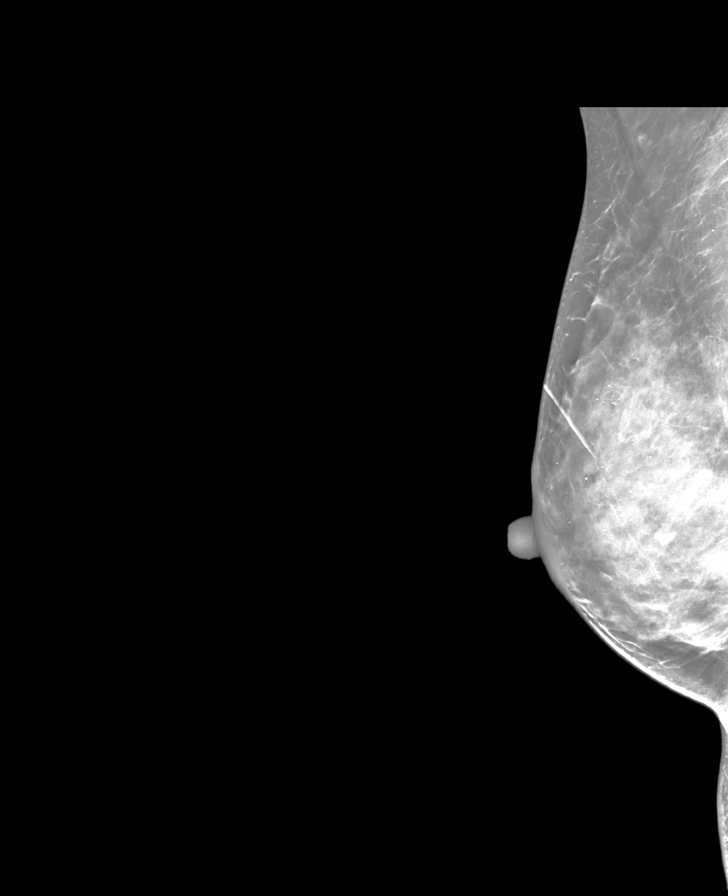

[L MLO synth-2D]
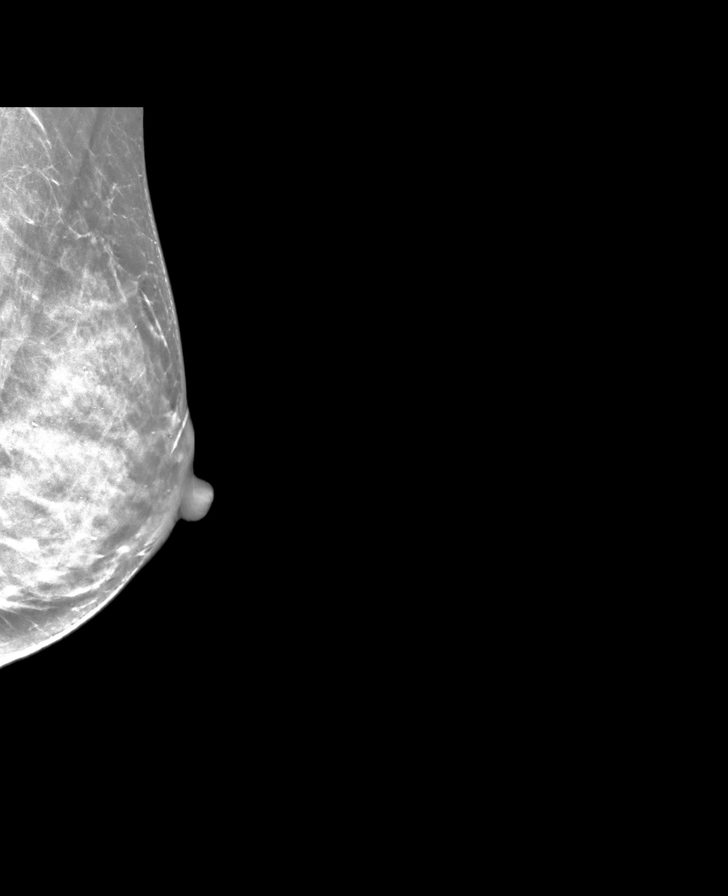

[L CC synth-2D]
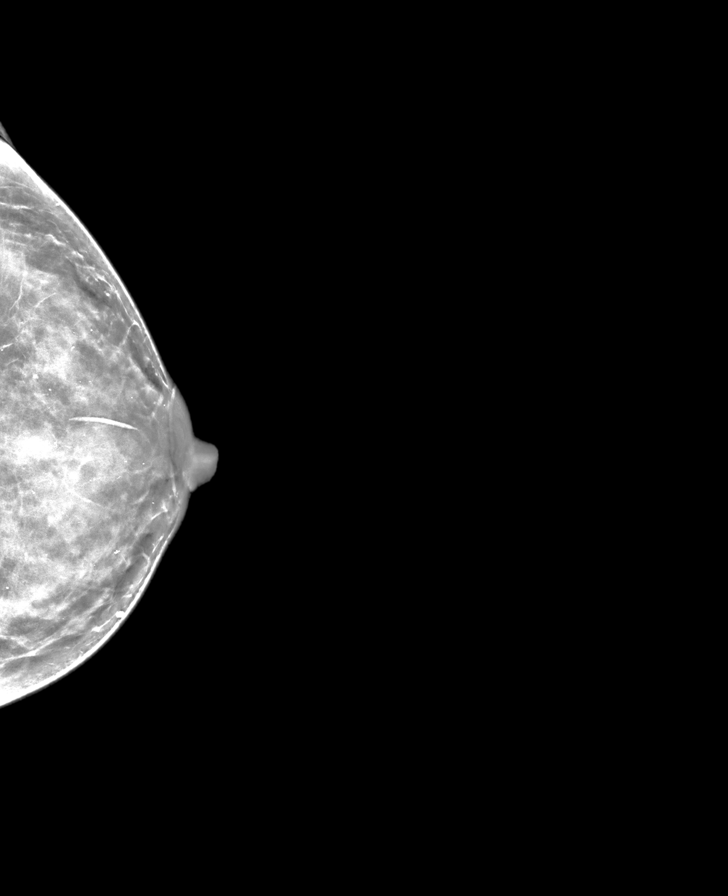

[R MLO tomo · tomo slice 35/68.0]
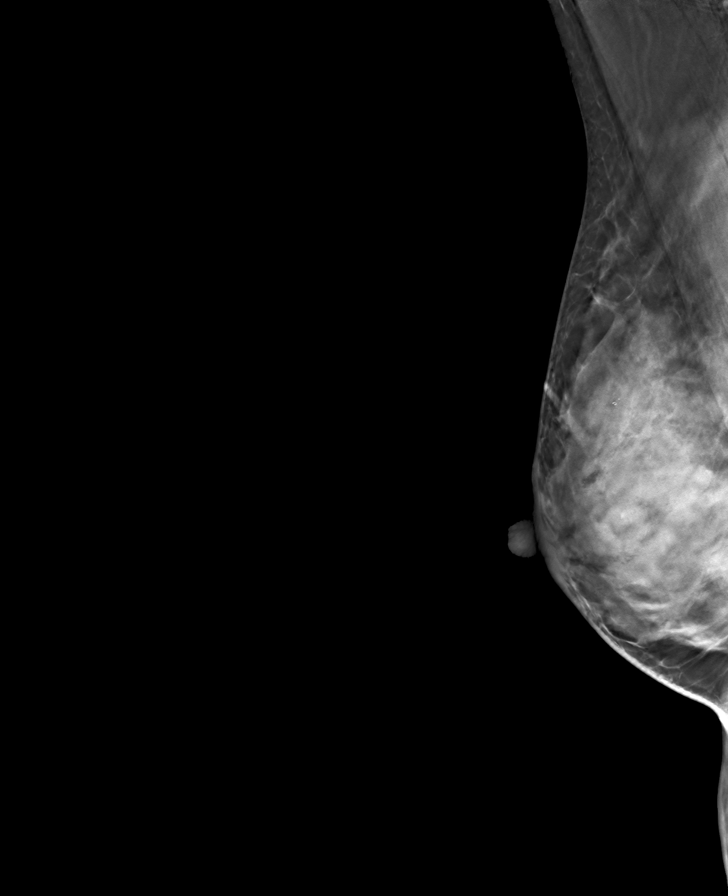

[L CC tomo · tomo slice 34/67.0]
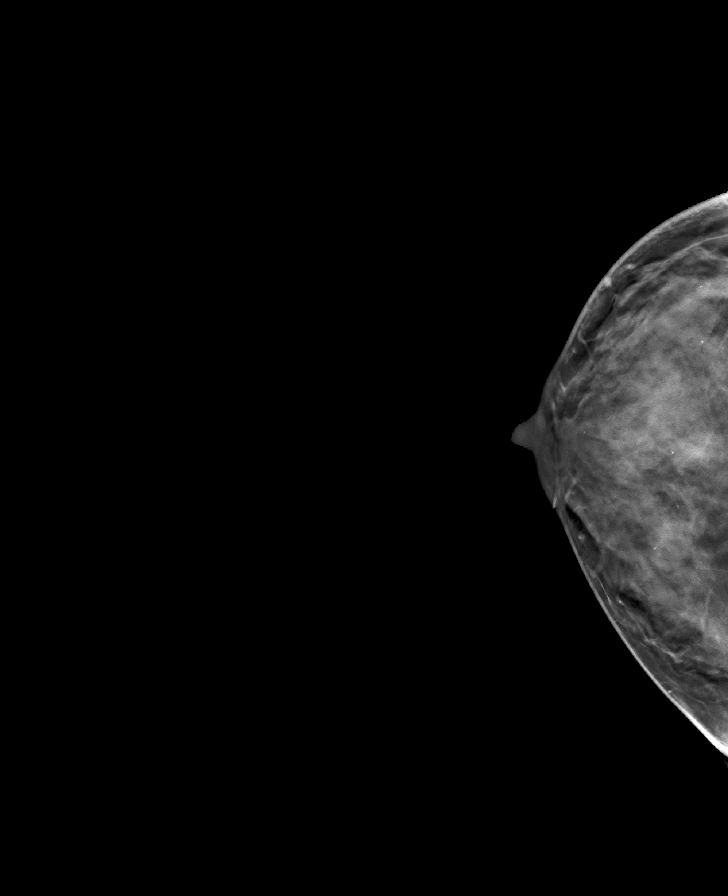

[L MLO tomo · tomo slice 33/64.0]
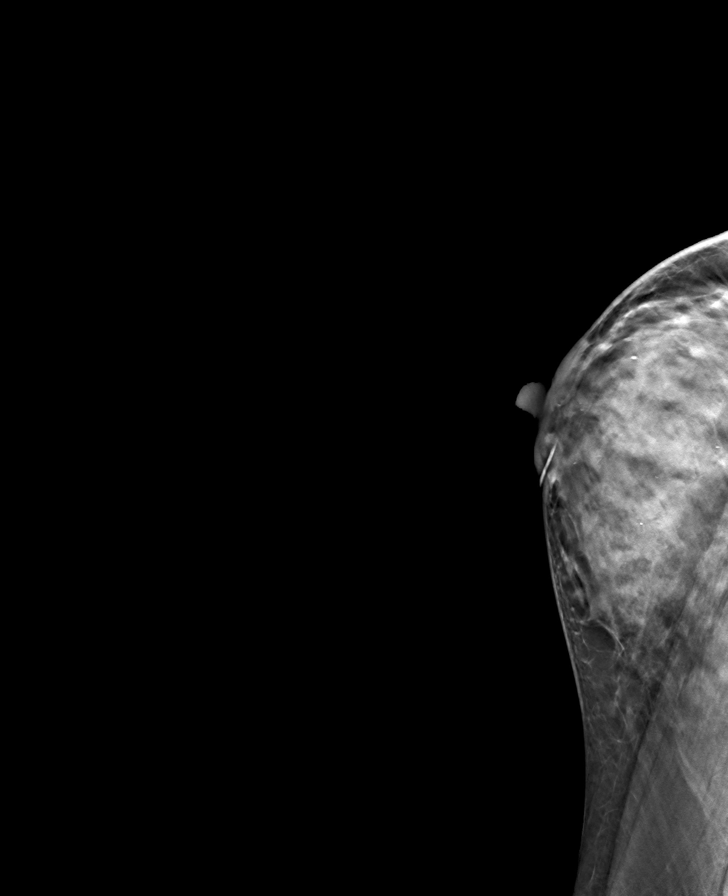

[R CC tomo · tomo slice 38/75.0]
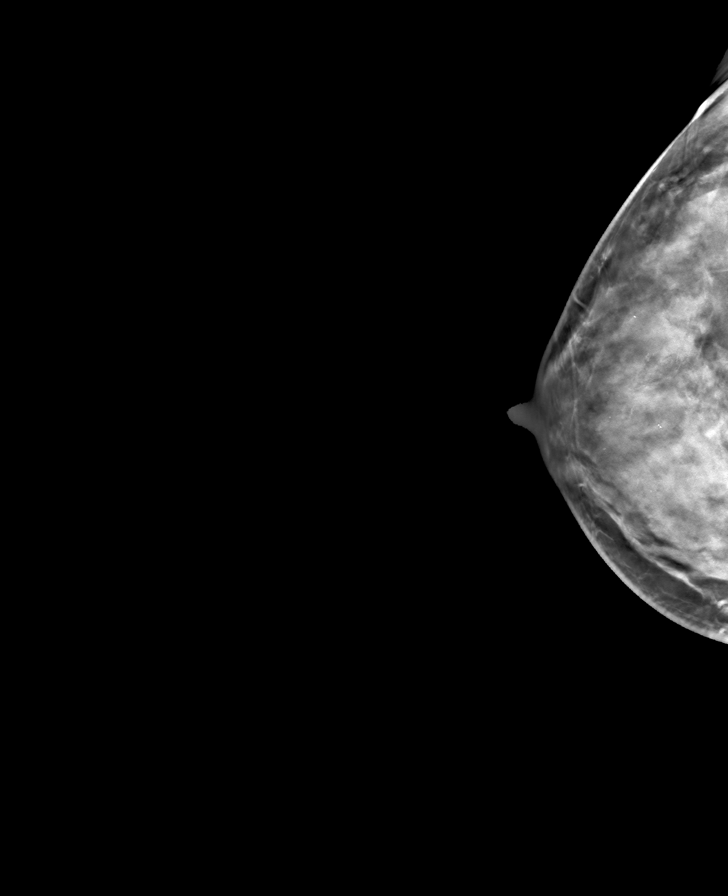

[8 of 24 positions shown; findings below may reference images not displayed]

ACR Breast Density Category d: The breast tissue is extremely dense,
which lowers the sensitivity of mammography
FINDINGS: There are no findings suspicious for malignancy. The images were
evaluated with computer-aided detection.
IMPRESSION: No mammographic evidence of malignancy. A result letter of this
screening mammogram will be mailed directly to the patient.

RECOMMENDATION:
Screening mammogram in one year. (Code:[1T])

BI-RADS CATEGORY  1: Negative.

## 2020-08-18 ENCOUNTER — Other Ambulatory Visit (HOSPITAL_COMMUNITY): Payer: Self-pay

## 2020-08-18 MED FILL — Topiramate Tab 25 MG: ORAL | 30 days supply | Qty: 90 | Fill #0 | Status: AC

## 2020-08-28 ENCOUNTER — Other Ambulatory Visit (HOSPITAL_COMMUNITY): Payer: Self-pay

## 2020-08-28 DIAGNOSIS — Z131 Encounter for screening for diabetes mellitus: Secondary | ICD-10-CM | POA: Diagnosis not present

## 2020-08-28 DIAGNOSIS — F4323 Adjustment disorder with mixed anxiety and depressed mood: Secondary | ICD-10-CM | POA: Diagnosis not present

## 2020-08-28 DIAGNOSIS — Z136 Encounter for screening for cardiovascular disorders: Secondary | ICD-10-CM | POA: Diagnosis not present

## 2020-08-28 DIAGNOSIS — Z0000 Encounter for general adult medical examination without abnormal findings: Secondary | ICD-10-CM | POA: Diagnosis not present

## 2020-08-28 MED ORDER — ESCITALOPRAM OXALATE 10 MG PO TABS
ORAL_TABLET | ORAL | 2 refills | Status: DC
  Filled 2020-08-28: qty 30, 30d supply, fill #0
  Filled 2020-09-26: qty 30, 30d supply, fill #1
  Filled 2020-11-11: qty 30, 30d supply, fill #2

## 2020-08-28 MED ORDER — ESZOPICLONE 3 MG PO TABS
ORAL_TABLET | ORAL | 0 refills | Status: DC
  Filled 2020-08-28: qty 90, 90d supply, fill #0

## 2020-09-27 ENCOUNTER — Other Ambulatory Visit (HOSPITAL_COMMUNITY): Payer: Self-pay

## 2020-09-28 MED FILL — Cyclobenzaprine HCl Tab 10 MG: ORAL | 30 days supply | Qty: 90 | Fill #1 | Status: AC

## 2020-09-28 MED FILL — Topiramate Tab 25 MG: ORAL | 30 days supply | Qty: 90 | Fill #1 | Status: AC

## 2020-09-29 ENCOUNTER — Other Ambulatory Visit (HOSPITAL_COMMUNITY): Payer: Self-pay

## 2020-09-30 ENCOUNTER — Other Ambulatory Visit (HOSPITAL_COMMUNITY): Payer: Self-pay

## 2020-10-01 ENCOUNTER — Other Ambulatory Visit (HOSPITAL_COMMUNITY): Payer: Self-pay

## 2020-10-12 ENCOUNTER — Other Ambulatory Visit (HOSPITAL_COMMUNITY): Payer: Self-pay

## 2020-10-14 ENCOUNTER — Other Ambulatory Visit (HOSPITAL_COMMUNITY): Payer: Self-pay

## 2020-10-14 MED ORDER — GABAPENTIN 300 MG PO CAPS
ORAL_CAPSULE | ORAL | 2 refills | Status: DC
  Filled 2020-10-14: qty 270, 90d supply, fill #0

## 2020-11-12 ENCOUNTER — Other Ambulatory Visit (HOSPITAL_COMMUNITY): Payer: Self-pay

## 2020-11-14 ENCOUNTER — Other Ambulatory Visit (HOSPITAL_COMMUNITY): Payer: Self-pay

## 2020-11-14 MED ORDER — NORETHINDRONE ACETATE 5 MG PO TABS
ORAL_TABLET | ORAL | 0 refills | Status: DC
  Filled 2020-11-14: qty 90, 90d supply, fill #0

## 2020-12-01 ENCOUNTER — Other Ambulatory Visit (HOSPITAL_COMMUNITY): Payer: Self-pay

## 2020-12-03 ENCOUNTER — Other Ambulatory Visit (HOSPITAL_COMMUNITY): Payer: Self-pay

## 2020-12-03 MED ORDER — ESZOPICLONE 3 MG PO TABS
ORAL_TABLET | ORAL | 0 refills | Status: DC
  Filled 2020-12-03: qty 90, 90d supply, fill #0

## 2020-12-09 ENCOUNTER — Ambulatory Visit (INDEPENDENT_AMBULATORY_CARE_PROVIDER_SITE_OTHER): Payer: 59 | Admitting: Cardiovascular Disease

## 2020-12-09 ENCOUNTER — Other Ambulatory Visit: Payer: Self-pay

## 2020-12-09 ENCOUNTER — Ambulatory Visit (INDEPENDENT_AMBULATORY_CARE_PROVIDER_SITE_OTHER): Payer: 59

## 2020-12-09 ENCOUNTER — Encounter: Payer: Self-pay | Admitting: Cardiovascular Disease

## 2020-12-09 VITALS — BP 131/86 | HR 84 | Ht 72.0 in | Wt 174.1 lb

## 2020-12-09 DIAGNOSIS — E785 Hyperlipidemia, unspecified: Secondary | ICD-10-CM

## 2020-12-09 DIAGNOSIS — R002 Palpitations: Secondary | ICD-10-CM | POA: Diagnosis not present

## 2020-12-09 DIAGNOSIS — Z136 Encounter for screening for cardiovascular disorders: Secondary | ICD-10-CM

## 2020-12-09 NOTE — Patient Instructions (Signed)
Medication Instructions:  Your physician recommends that you continue on your current medications as directed. Please refer to the Current Medication list given to you today.  *If you need a refill on your cardiac medications before your next appointment, please call your pharmacy*   Lab Work: None ordered If you have labs (blood work) drawn today and your tests are completely normal, you will receive your results only by: [place] (if you have MyChart) OR A paper copy in the mail If you have any lab test that is abnormal or we need to change your treatment, we will call you to review the results.   Testing/Procedures: Your physician has recommended that you wear a Zio monitor XT. (To be worn for 14 days)  This monitor is a medical device that records the heart's electrical activity. Doctors most often use these monitors to diagnose arrhythmias. Arrhythmias are problems with the speed or rhythm of the heartbeat. The monitor is a small device applied to your chest. You can wear one while you do your normal daily activities. While wearing this monitor if you have any symptoms to push the button and record what you felt. Once you have worn this monitor for the period of time provider prescribed (Usually 14 days), you will return the monitor device in the postage paid box. Once it is returned they will download the data collected and provide [place] with a report which the provider will then review and we will call you with those results. Important tips:  Avoid showering during the first 24 hours of wearing the monitor. Avoid excessive sweating to help maximize wear time. Do not submerge the device, no hot tubs, and no swimming pools. Keep any lotions or oils away from the patch. After 24 hours you may shower with the patch on. Take brief showers with your back facing the shower head.  Do not remove patch once it has been placed because that will interrupt data and decrease adhesive wear  time. Push the button when you have any symptoms and write down what you were feeling. Once you have completed wearing your monitor, remove and place into box which has postage paid and place in your outgoing mailbox.  If for some reason you have misplaced your box then call our office and we can provide another box and/or mail it off for you.  Dr. Fletcher Anon has ordered a CT coronary calcium score. This test is done at the [place] (Please call [phone]) This is $99 out of pocket.   Coronary CalciumScan A coronary calcium scan is an imaging test used to look for deposits of calcium and other fatty materials (plaques) in the inner lining of the blood vessels of the heart (coronary arteries). These deposits of calcium and plaques can partly clog and narrow the coronary arteries without producing any symptoms or warning signs. This puts a person at risk for a heart attack. This test can detect these deposits before symptoms develop. Tell a health care provider about: Any allergies you have. All medicines you are taking, including vitamins, herbs, eye drops, creams, and over-the-counter medicines. Any problems you or family members have had with anesthetic medicines. Any blood disorders you have. Any surgeries you have had. Any medical conditions you have. Whether you are pregnant or may be pregnant. What are the risks? Generally, this is a safe procedure. However, problems may occur, including: Harm to a pregnant woman and her unborn baby. This test involves  the use of radiation. Radiation exposure can be dangerous to a pregnant woman and her unborn baby. If you are pregnant, you generally should not have this procedure done. Slight increase in the risk of cancer. This is because of the radiation involved in the test. What happens before the procedure? No preparation is needed for this procedure. What happens during the procedure? You will  undress and remove any jewelry around your neck or chest. You will put on a hospital gown. Sticky electrodes will be placed on your chest. The electrodes will be connected to an electrocardiogram (ECG) machine to record a tracing of the electrical activity of your heart. A CT scanner will take pictures of your heart. During this time, you will be asked to lie still and hold your breath for 2-3 seconds while a picture of your heart is being taken. The procedure may vary among health care providers and hospitals. What happens after the procedure? You can get dressed. You can return to your normal activities. It is up to you to get the results of your test. Ask your health care provider, or the department that is doing the test, when your results will be ready. Summary A coronary calcium scan is an imaging test used to look for deposits of calcium and other fatty materials (plaques) in the inner lining of the blood vessels of the heart (coronary arteries). Generally, this is a safe procedure. Tell your health care provider if you are pregnant or may be pregnant. No preparation is needed for this procedure. A CT scanner will take pictures of your heart. You can return to your normal activities after the scan is done. This information is not intended to replace advice given to you by your health care provider. Make sure you discuss any questions you have with your health care provider. Document Released: 09/11/2007 Document Revised: 02/02/2016 Document Reviewed: 02/02/2016 Elsevier Interactive Patient Education  2017 [place]: At [facility], you and your health needs are our priority.  As part of our continuing mission to provide you with exceptional heart care, we have created designated Provider Care Teams.  These Care Teams include your primary Cardiologist (physician) and Advanced Practice Providers (APPs -  Physician Assistants and Nurse Practitioners) who all work  together to provide you with the care you need, when you need it.  We recommend signing up for the patient portal called "MyChart".  Sign up information is provided on this After Visit Summary.  MyChart is used to connect with patients for Virtual Visits (Telemedicine).  Patients are able to view lab/test results, encounter notes, upcoming appointments, etc.  Non-urgent messages can be sent to your provider as well.   To learn more about what you can do with MyChart, go to [email or web address].    Your next appointment:   As needed  The format for your next appointment:   In Person  Provider:   You may see Kathlyn Sacramento, MD or one of the following Advanced Practice Providers on your designated Care Team:   Murray Hodgkins, NP Christell Faith, PA-C Marrianne Mood, PA-C Cadence Kathlen Mody, [place]   Other Instructions N/A

## 2020-12-09 NOTE — Progress Notes (Signed)
Cardiology Office Note   Date:  12/09/2020   ID:  Kathleen Diaz, DOB May 09, 1965, MRN [id number]  PCP:  Kathleen Blanco, MD  Cardiologist:   Kathleen Sacramento, MD   Chief Complaint  Patient presents with   Other    Afib/lightheadedness. Meds reviewed verbally with pt.      History of Present Illness: Kathleen Diaz is a 55 y.o. 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  She is self-referred for evaluation of palpitations and possible atrial fibrillation.  She is a long-distance runner for many years but had to slow down last year due to herniated disc.  During one of her long rounds last year she did have some chest discomfort that made her stop but it was not isolated incident.  She started going to the gym again recently and had 1 episode of dizziness.  In addition, she had 1 episode of tachycardia recorded by her apple watch with a heart rate of 120 bpm at rest but she was waiting to meet a lawyer and thus there was some stress involved.  Yesterday, she ran 6 miles with no symptoms.  She had some episodes of dizziness at home and she recently purchased a new apple watch.  She recorded her EKG and was told possible short episodes of atrial fibrillation.  These were personally reviewed by me today and it appears that her heart rate was regular in the 80s and not consistent with atrial fibrillation. No syncope. She has no risk factors for ischemic heart disease other than hyperlipidemia.  No family history of premature coronary artery disease.  She is a lifelong non-smoker and does not drink alcohol.    Past Medical History:  Diagnosis Date   Chronic bilateral low back pain with left-sided sciatica    DDD (degenerative disc disease), lumbar    External hemorrhoids    Hyperprolactinemia ([facility])    Intervertebral disc disorder with radiculopathy of lumbar region    Migraine with aura and without status migrainosus    Migraines    New daily persistent headache    Paresthesia of hand,  bilateral    Paresthesia of left foot    Prehypertension    S/P selective transsphenoidal pituitary adenomectomy    Shift work sleep disorder     Past Surgical History:  Procedure Laterality Date   BREAST EXCISIONAL BIOPSY     COLONOSCOPY WITH PROPOFOL N/A 03/17/2020   Procedure: COLONOSCOPY WITH PROPOFOL;  Surgeon: Toledo, [name], MD;  Location: [facility] ENDOSCOPY;  Service: Gastroenterology;  Laterality: N/A;   ESOPHAGOGASTRODUODENOSCOPY (EGD) WITH PROPOFOL N/A 03/17/2020   Procedure: ESOPHAGOGASTRODUODENOSCOPY (EGD) WITH PROPOFOL;  Surgeon: Toledo, [name], MD;  Location: [facility];  Service: Gastroenterology;  Laterality: N/A;   PITUITARY SURGERY       Current Outpatient Medications  Medication Sig Dispense Refill   cyclobenzaprine (FLEXERIL) 10 MG tablet Take 10 mg by mouth 3 (three) times daily as needed for muscle spasms.     escitalopram (LEXAPRO) 10 MG tablet Take 1 tablet (10 mg total) by mouth once daily 30 tablet 2   Eszopiclone 3 MG TABS Take 1 tablet (3 mg total) by mouth at bedtime as needed for sleep for up to 90 days 90 tablet 0   fluticasone (FLONASE) 50 MCG/ACT nasal spray Place into both nostrils daily.     gabapentin (NEURONTIN) 300 MG capsule TAKE 1 CAPSULE BY MOUTH THREE TIMES A DAY AS NEEDED FOR UP TO 180 DAYS 270 capsule 1   hydrocortisone (  ANUSOL-HC) 25 MG suppository Place 25 mg rectally 2 (two) times daily.     predniSONE (STERAPRED UNI-PAK 21 TAB) 10 MG (21) TBPK tablet TAKE AS DIRECTED ON PACKAGE. 21 each 0   SUMAtriptan (IMITREX) 100 MG tablet Take 100 mg by mouth every 2 (two) hours as needed for migraine. May repeat in 2 hours if headache persists or recurs.     topiramate (TOPAMAX) 25 MG capsule Take 25 mg by mouth 2 (two) times daily.     No current facility-administered medications for this visit.    Allergies:   Latex    Social History:  The patient  reports that she has never smoked. She has never used smokeless tobacco. She reports that  she does not currently use alcohol. She reports that she does not use drugs.   Family History:  The patient's family history includes Breast cancer in her cousin, cousin, maternal aunt, maternal aunt, paternal aunt, and paternal aunt.    ROS:  Please see the history of present illness.   Otherwise, review of systems are positive for none.   All other systems are reviewed and negative.    PHYSICAL EXAM: VS:  BP 131/86 (BP Location: Right Arm, Patient Position: Sitting, Cuff Size: Normal)   Pulse 84   Ht 6' (1.829 m)   Wt 174 lb 2 oz (79 kg)   SpO2 98%   BMI 23.62 kg/m  , BMI Body mass index is 23.62 kg/m. GEN: Well nourished, well developed, in no acute distress  HEENT: normal  Neck: no JVD, carotid bruits, or masses Cardiac: RRR; no murmurs, rubs, or gallops,no edema  Respiratory:  clear to auscultation bilaterally, normal work of breathing GI: soft, nontender, nondistended, + BS MS: no deformity or atrophy  Skin: warm and dry, no rash Neuro:  Strength and sensation are intact Psych: euthymic mood, full affect   EKG:  EKG is ordered today. The ekg ordered today demonstrates normal sinus rhythm with no significant ST or T wave changes.   Recent Labs: No results found for requested labs within last 8760 hours.    Lipid Panel No results found for: CHOL, TRIG, HDL, CHOLHDL, VLDL, LDLCALC, LDLDIRECT    Wt Readings from Last 3 Encounters:  12/09/20 174 lb 2 oz (79 kg)  03/17/20 174 lb (78.9 kg)       PAD Screen 12/09/2020  Previous PAD dx? No  Previous surgical procedure? No  Pain with walking? No  Feet/toe relief with dangling? No  Painful, non-healing ulcers? No  Extremities discolored? No      ASSESSMENT AND PLAN:  1.  Palpitations: I reviewed rhythm strips on her apple watch and do not see evidence of atrial fibrillation.  Given her symptoms, I am going to obtain a 2-week ZIO monitor.  I explained to her that there is higher incidence of paroxysmal atrial  fibrillation and long distance runner's.  2.  Hyperlipidemia: The patient had recent lipid profile that showed an LDL of 145 and an HDL of 58.  I do think she benefits from CT calcium score for risk stratification and this was requested today.  The patient has no anginal symptoms and she was able to run 6 miles yesterday without limitations.    Disposition:   FU with me as needed.  Signed,  Kathleen Sacramento, MD  12/09/2020 2:27 PM    [place]

## 2020-12-18 ENCOUNTER — Other Ambulatory Visit: Payer: Self-pay | Admitting: Neurology

## 2020-12-18 DIAGNOSIS — R519 Headache, unspecified: Secondary | ICD-10-CM

## 2020-12-19 ENCOUNTER — Other Ambulatory Visit: Payer: Self-pay

## 2020-12-19 ENCOUNTER — Ambulatory Visit
Admission: RE | Admit: 2020-12-19 | Discharge: 2020-12-19 | Disposition: A | Discharge status: Home / Self Care | Payer: 59 | Source: Ambulatory Visit | Attending: Cardiovascular Disease | Admitting: Cardiovascular Disease

## 2020-12-19 DIAGNOSIS — J8410 Pulmonary fibrosis, unspecified: Secondary | ICD-10-CM | POA: Insufficient documentation

## 2020-12-19 DIAGNOSIS — Z136 Encounter for screening for cardiovascular disorders: Secondary | ICD-10-CM | POA: Insufficient documentation

## 2020-12-19 IMAGING — CT CT CARDIAC CORONARY ARTERY CALCIUM SCORE
3 series · 14 of 20 positions shown, 16 images · non-contrast
Comparison: None.
COMPARISON: None.

Addendum:
EXAM:
OVER-READ INTERPRETATION  CT CHEST

The following report is an over-read performed by radiologist Dr.
YENI [REDACTED] on [DATE]. This
over-read does not include interpretation of cardiac or coronary
anatomy or pathology. The coronary calcium score/coronary CTA
interpretation by the cardiologist is attached.
CLINICAL DATA: Risk stratification
Coronary Calcium Score
TECHNIQUE: The patient was scanned on a Siemens Somatom go.Top Scanner. Axial
non-contrast 3 mm slices were carried out through the heart. The
data set was analyzed on a dedicated work station and scored using
the Agatson method.

[Series 2: sa36 calcium scoring 3.00 · axial · 0.27mm/px · z∈[-1165,-1084]mm · 4 of 46 slices shown]
[im 10/46  vessel]
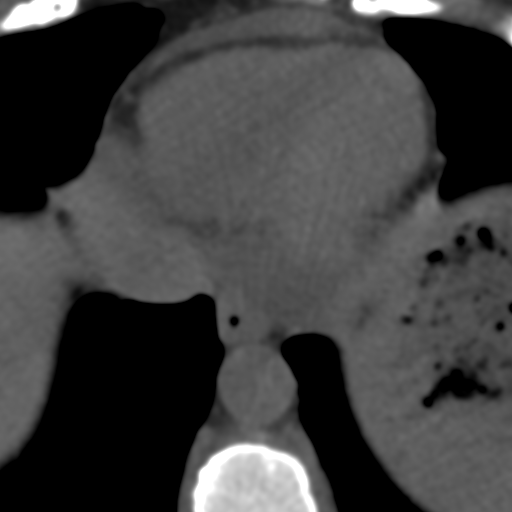
[im 19/46  vessel]
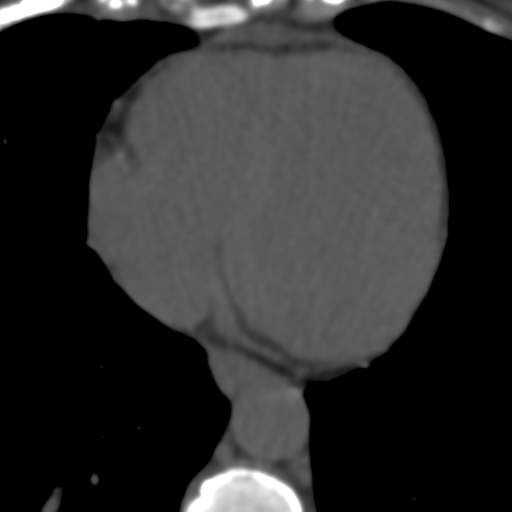
[im 28/46  vessel]
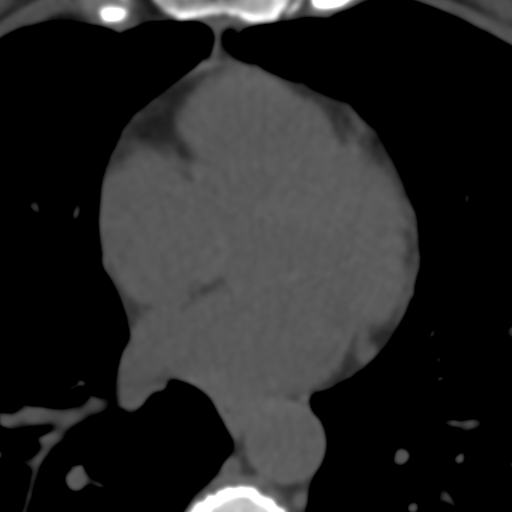
[im 37/46  vessel]
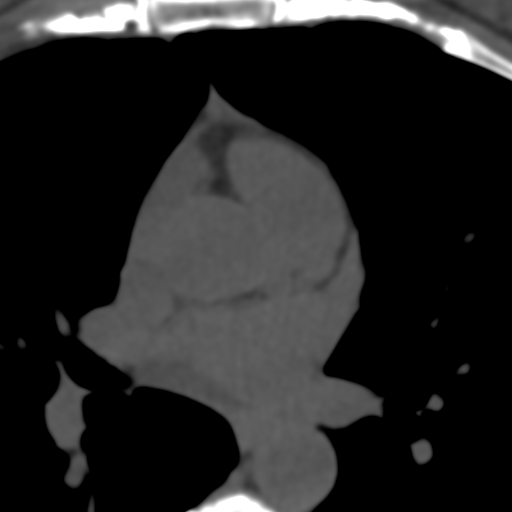

[Series 5: full fov st calcium scoring 3.00 · axial · 0.61mm/px · z∈[-1171,-1081]mm · 5 of 46 slices shown, 7 images]
[im 8/46  vessel]
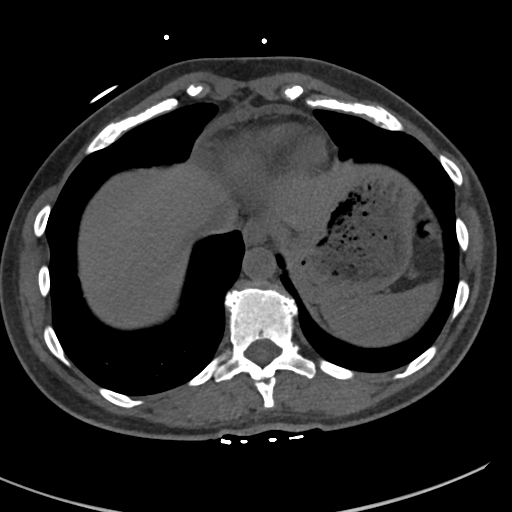
[im 8/46  lung]
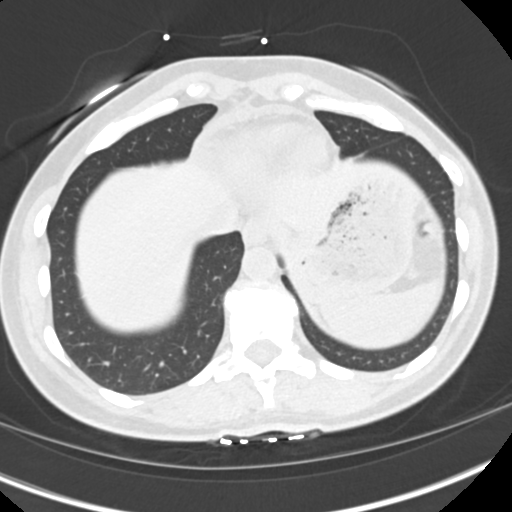
[im 16/46  vessel]
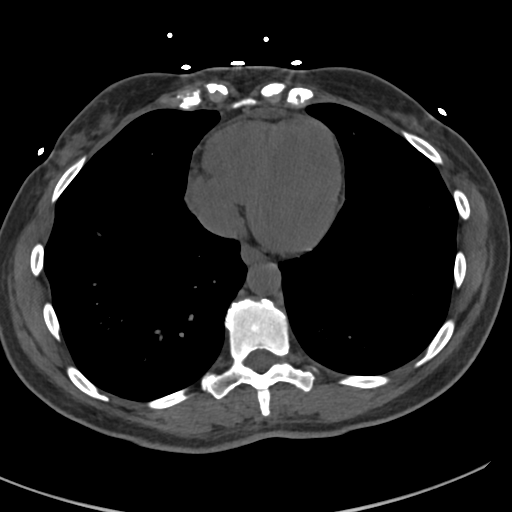
[im 23/46  vessel]
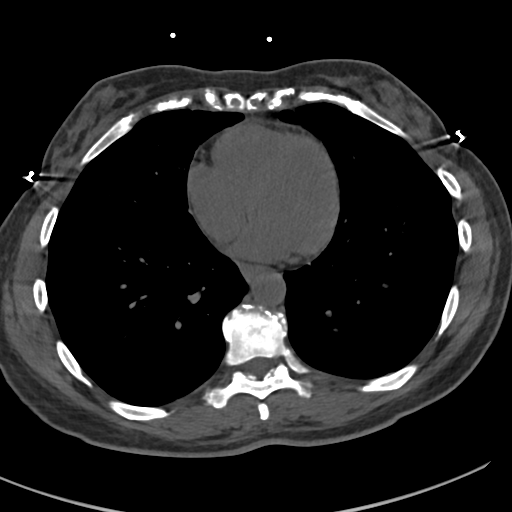
[im 31/46  vessel]
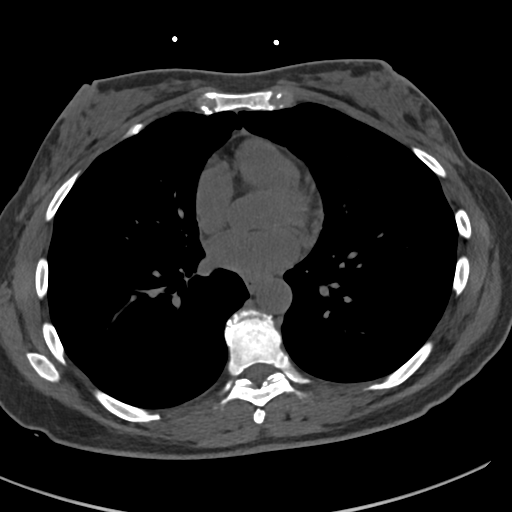
[im 38/46  vessel]
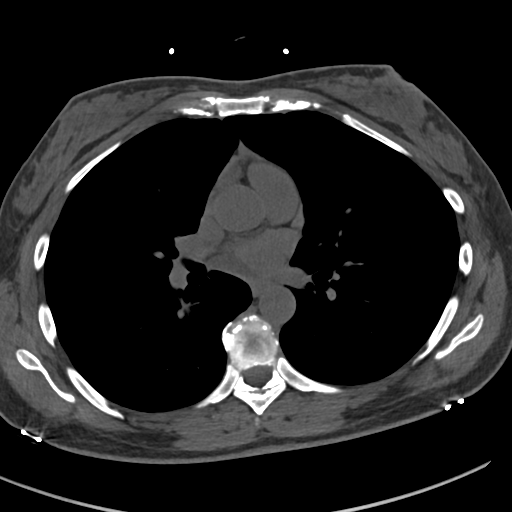
[im 38/46  lung]
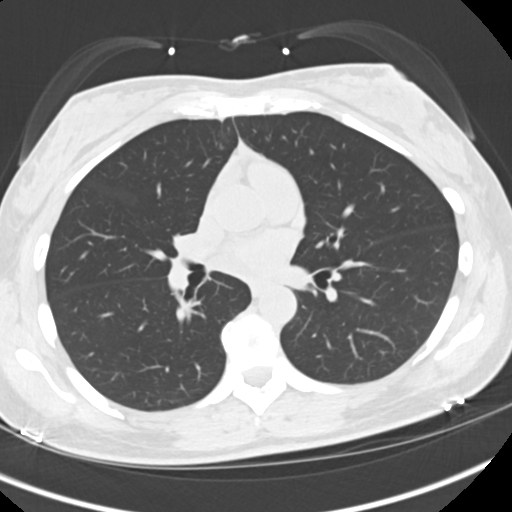

[Series 10: full fov lungs calcium scoring 3.00 ax · axial · 0.61mm/px · z∈[-1171,-1081]mm · 5 of 46 slices shown]
[im 8/46  vessel]
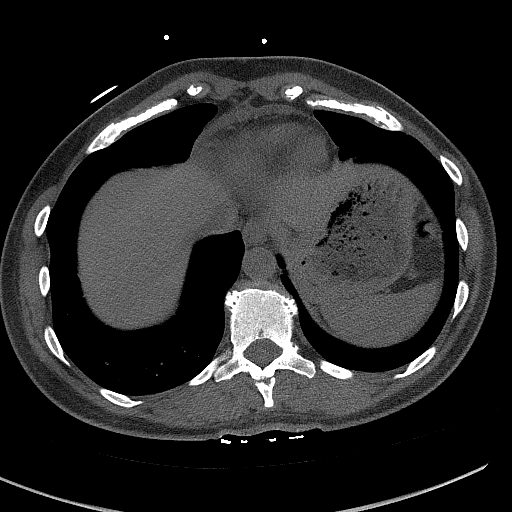
[im 16/46  vessel]
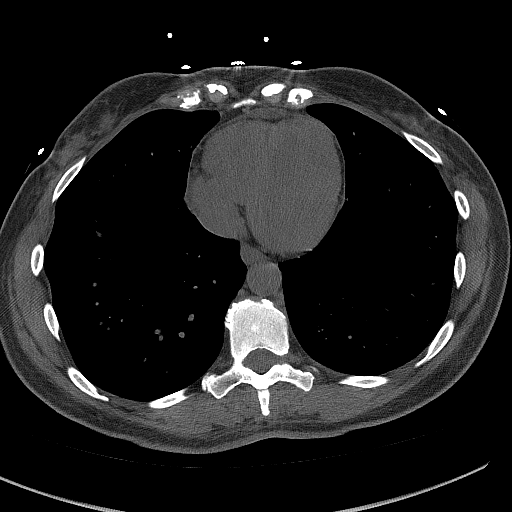
[im 23/46  vessel]
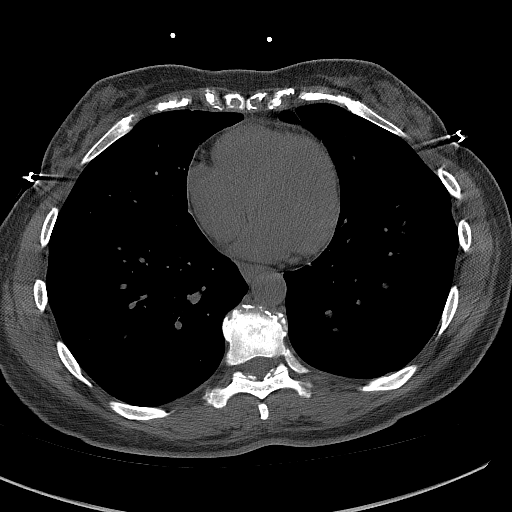
[im 31/46  vessel]
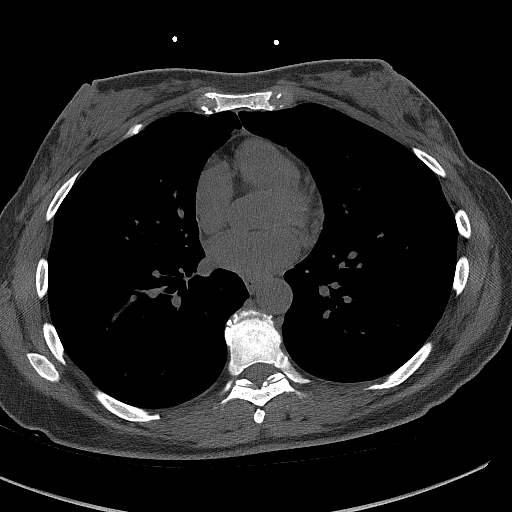
[im 38/46  vessel]
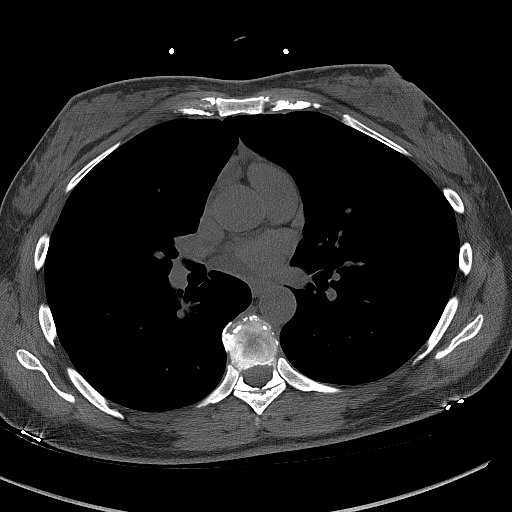

[14 of 20 positions shown; findings below may reference images not displayed]

FINDINGS: Small calcified granuloma in the left lower lobe. Within the
visualized portions of the thorax there are no other suspicious
appearing pulmonary nodules or masses, there is no acute
consolidative airspace disease, no pleural effusions, no
pneumothorax and no lymphadenopathy. Visualized portions of the
upper abdomen demonstrates a well-defined low-attenuation lesion
measuring 1.7 x 1.2 cm in the central aspect of segment 7 of the
liver, incompletely characterized on today's non-contrast CT
examination, but statistically likely a cyst there are no aggressive
appearing lytic or blastic lesions noted in the visualized portions
of the skeleton.
IMPRESSION: 1. No significant incidental noncardiac findings are noted.
FINDINGS: Non-cardiac: See separate report from [REDACTED].

Ascending Aorta: Normal size

Pericardium: Normal

Coronary arteries: Normal origin of left and right coronary
arteries. Distribution of arterial calcifications if present, as
noted below;

LM 0

LAD 0

LCx 0

RCA 0

Total 0

IMPRESSION AND RECOMMENDATION:
1. Normal coronary calcium score of 0. Patient is low risk for
coronary events.

2.  CAC 0, YENI0.

3.  Continue heart healthy lifestyle and risk factor modification.

YENI

*** End of Addendum ***
EXAM:
OVER-READ INTERPRETATION  CT CHEST

The following report is an over-read performed by radiologist Dr.
YENI [REDACTED] on [DATE]. This
over-read does not include interpretation of cardiac or coronary
anatomy or pathology. The coronary calcium score/coronary CTA
interpretation by the cardiologist is attached.
FINDINGS: Small calcified granuloma in the left lower lobe. Within the
visualized portions of the thorax there are no other suspicious
appearing pulmonary nodules or masses, there is no acute
consolidative airspace disease, no pleural effusions, no
pneumothorax and no lymphadenopathy. Visualized portions of the
upper abdomen demonstrates a well-defined low-attenuation lesion
measuring 1.7 x 1.2 cm in the central aspect of segment 7 of the
liver, incompletely characterized on today's non-contrast CT
examination, but statistically likely a cyst there are no aggressive
appearing lytic or blastic lesions noted in the visualized portions
of the skeleton.
IMPRESSION: 1. No significant incidental noncardiac findings are noted.

## 2020-12-24 ENCOUNTER — Other Ambulatory Visit (HOSPITAL_COMMUNITY): Payer: Self-pay

## 2020-12-24 DIAGNOSIS — N924 Excessive bleeding in the premenopausal period: Secondary | ICD-10-CM | POA: Diagnosis not present

## 2020-12-24 MED ORDER — MEDROXYPROGESTERONE ACETATE 10 MG PO TABS
ORAL_TABLET | ORAL | 0 refills | Status: DC
  Filled 2020-12-24: qty 20, 10d supply, fill #0

## 2020-12-25 ENCOUNTER — Ambulatory Visit
Admission: RE | Admit: 2020-12-25 | Discharge: 2020-12-25 | Disposition: A | Discharge status: Home / Self Care | Payer: 59 | Source: Ambulatory Visit | Attending: Neurology | Admitting: Neurology

## 2020-12-25 ENCOUNTER — Other Ambulatory Visit: Payer: Self-pay

## 2020-12-25 DIAGNOSIS — R519 Headache, unspecified: Secondary | ICD-10-CM

## 2020-12-25 IMAGING — MR MR MRA HEAD W/O CM
1 series · 19 of 48 positions shown · non-contrast
Comparison: Brain MRI today, and [DATE].

CLINICAL DATA: 55-year-old female with frontal headache for 6
months.

EXAM:
MRA HEAD WITHOUT CONTRAST
TECHNIQUE: Angiographic images of the Circle of Willis were acquired using MRA
technique without intravenous contrast.

[Series 8: TOF · axial · 0.5mm · 0.41mm/px · z∈[-98,-3]mm · 19 of 205 slices shown]
[im 1/205]
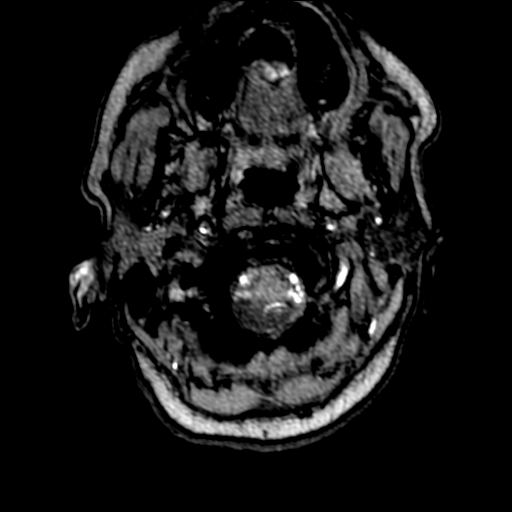
[im 5/205]
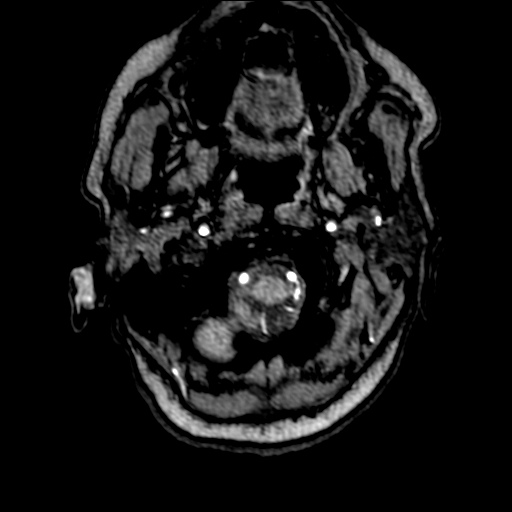
[im 9/205]
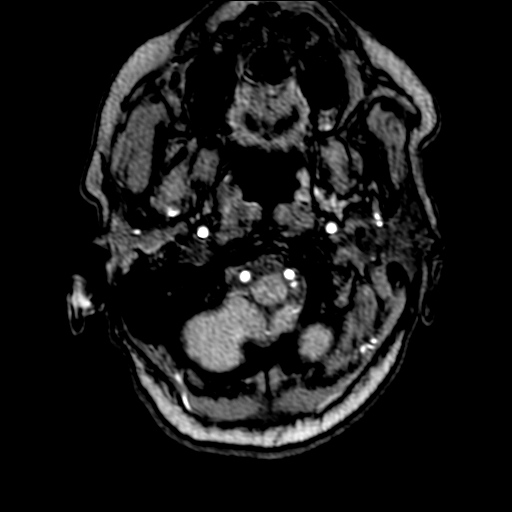
[im 14/205]
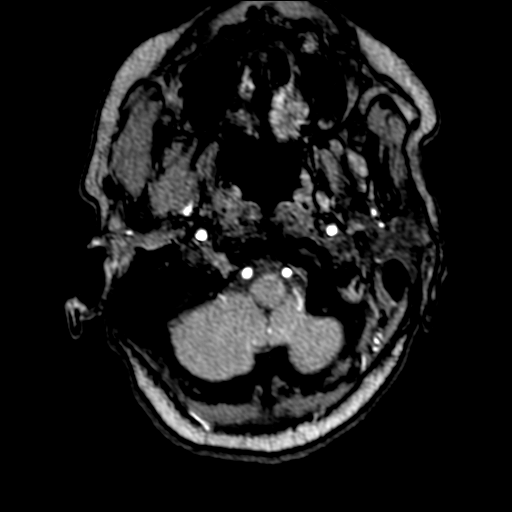
[im 18/205]
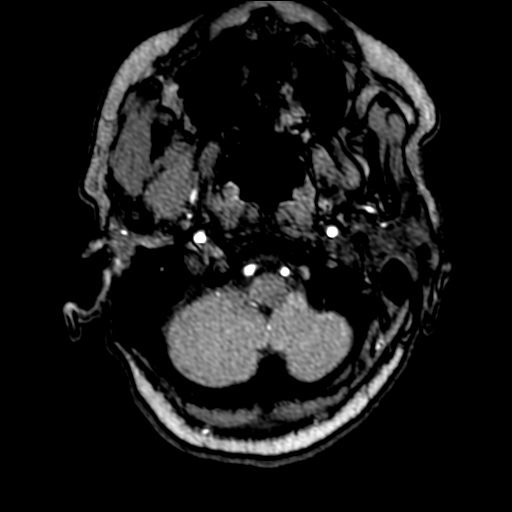
[im 22/205]
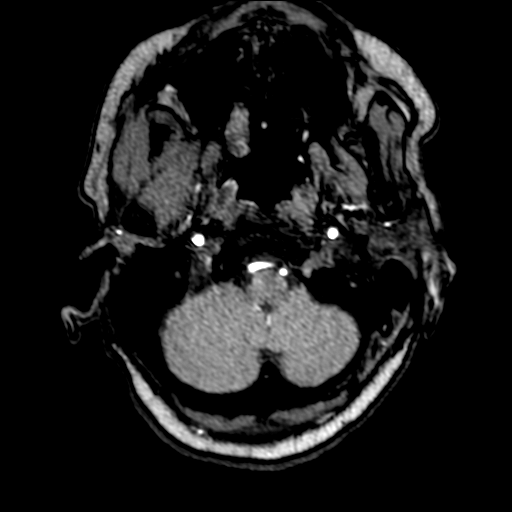
[im 27/205]
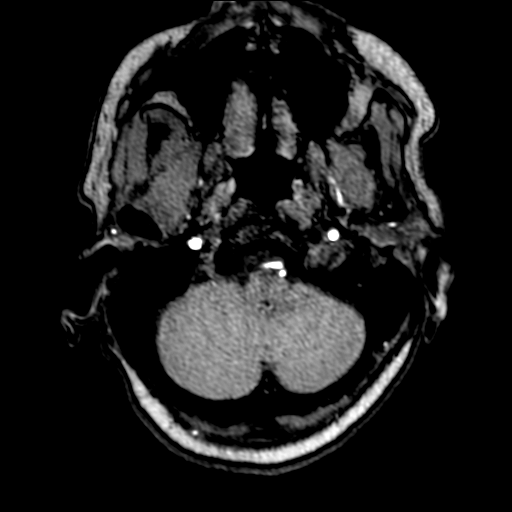
[im 31/205]
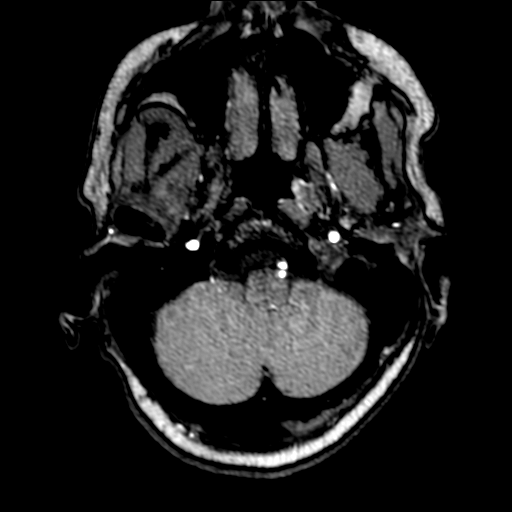
[im 35/205]
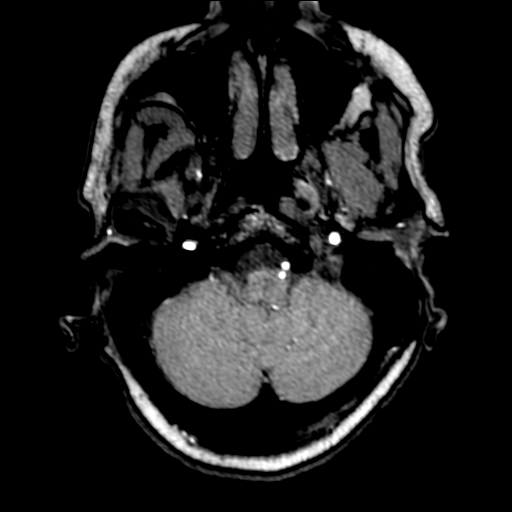
[im 40/205]
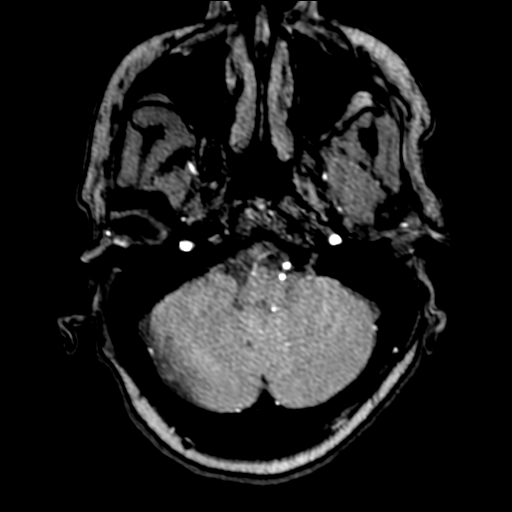
[im 44/205]
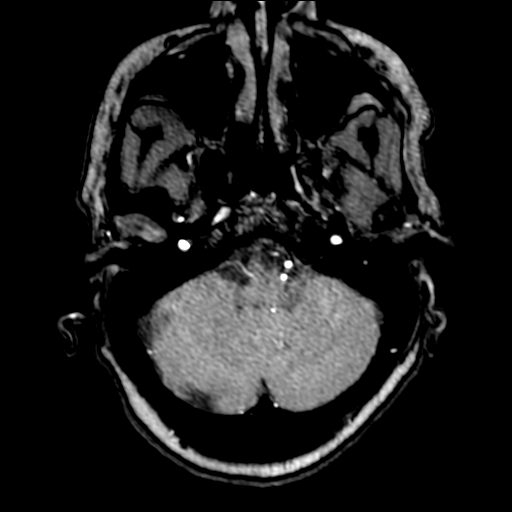
[im 66/205]
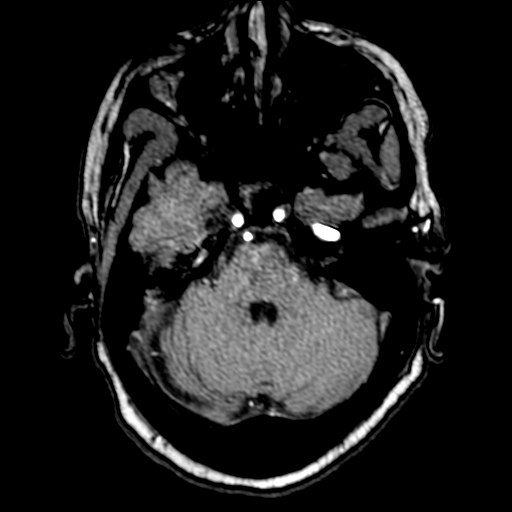
[im 92/205]
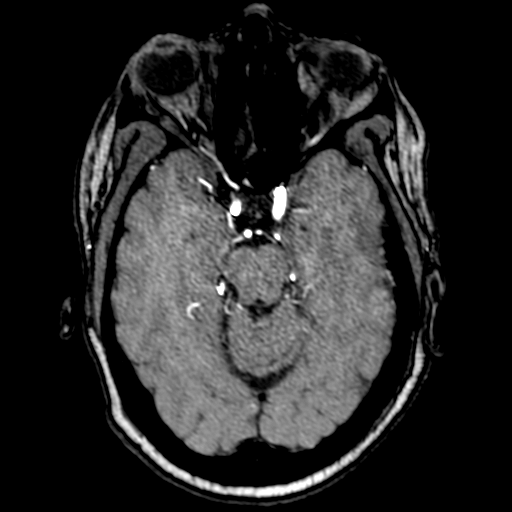
[im 105/205]
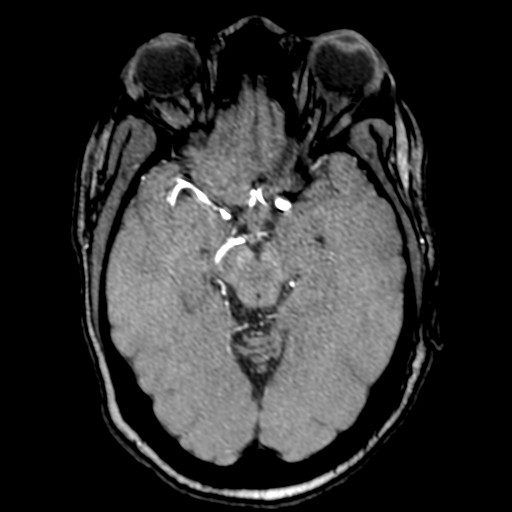
[im 118/205]
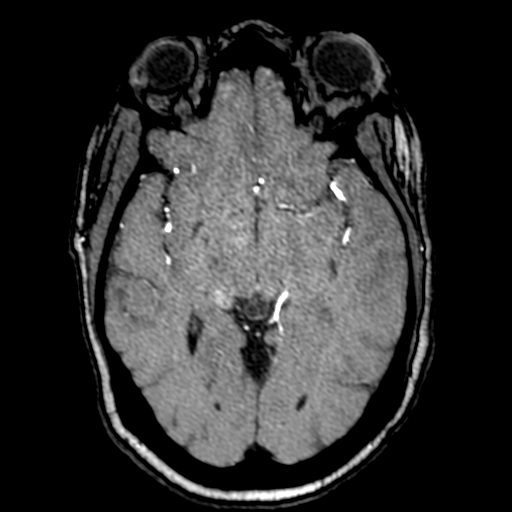
[im 144/205]
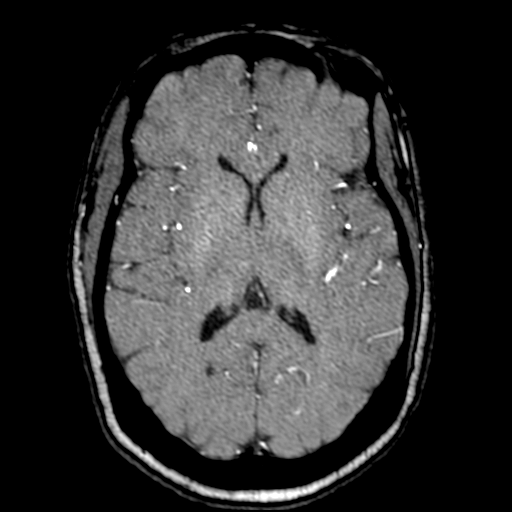
[im 170/205]
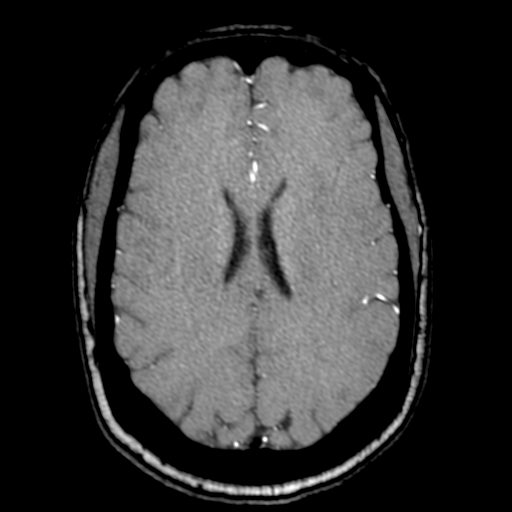
[im 174/205]
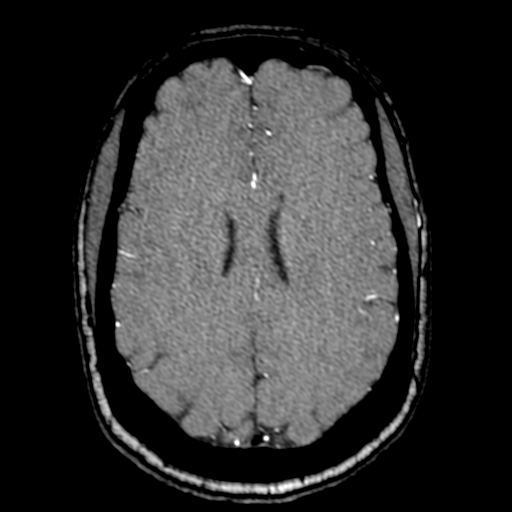
[im 196/205]
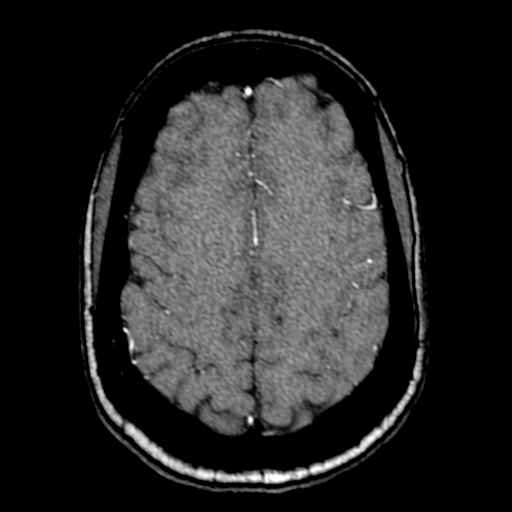

[19 of 48 positions shown; findings below may reference images not displayed]

FINDINGS: Antegrade flow in the posterior circulation with tortuous but
otherwise normal distal vertebral arteries, vertebrobasilar
junction, and basilar artery. Fairly codominant distal vertebrals.
Patent left PICA is visible at the skull base. Right PICA origin is
normal on series 8, image 50. No distal vertebral or basilar
stenosis. AICA and PCA origins are patent. Left posterior
communicating artery is present on series 8, image 99. The right
posterior communicating artery is diminutive or absent. Mild
bilateral PCA tortuosity. PCA branches are within normal limits.

Antegrade flow in both ICA siphons. Bilateral siphon tortuosity. No
siphon stenosis. Ophthalmic and left posterior communicating artery
origins appear normal. Patent carotid termini. Normal MCA and ACA
origins. Anterior communicating artery and visible ACA branches are
within normal limits. MCA M1 segments and MCA bi/trifurcations
appear patent without stenosis. Visible bilateral MCA branches are
within normal limits.
IMPRESSION: Negative intracranial MRA aside from generalized arterial
tortuosity.

## 2020-12-25 IMAGING — MR MR HEAD WO/W CM
12 of 19 series · 24 of 48 positions shown · IV contrast (7ml Gadavist)
Comparison: Pituitary protocol Brain MRI [DATE].

CLINICAL DATA: 55-year-old female with frontal headache for 6
months.

EXAM:
MRI HEAD WITHOUT AND WITH CONTRAST
TECHNIQUE: Multiplanar, multiecho pulse sequences of the brain and surrounding
structures were obtained without and with intravenous contrast.
CONTRAST:  7mL GADAVIST GADOBUTROL 1 MMOL/ML IV SOLN

[Series 13: T2 · axial · 5.0mm · 0.53mm/px · z∈[-109,+44]mm · 2 of 27 slices shown (1 of 2)]
[im 1/27]
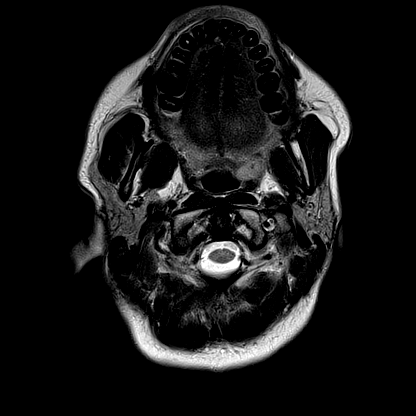
[im 27/27]
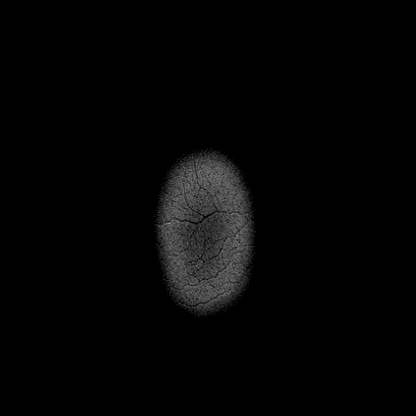

[Series 15: FLAIR · axial · 3.0mm · 0.53mm/px · z∈[-112,+47]mm · 5 of 55 slices shown]
[im 1/55]
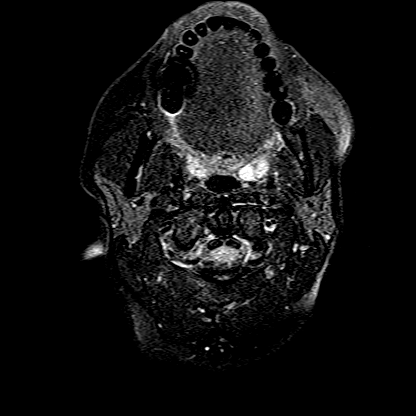
[im 14/55]
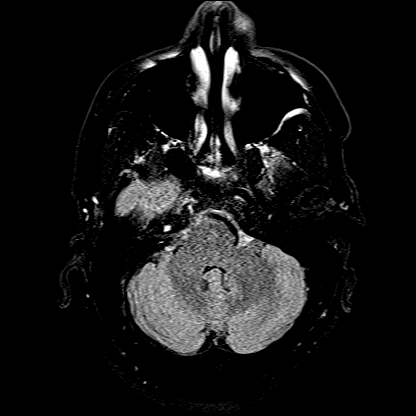
[im 28/55]
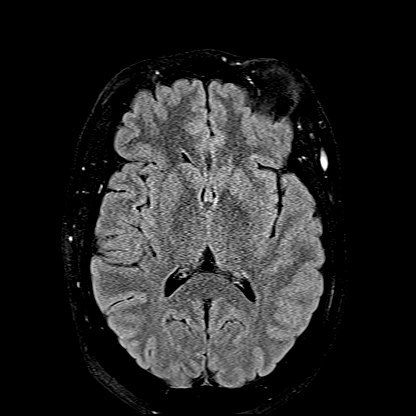
[im 41/55]
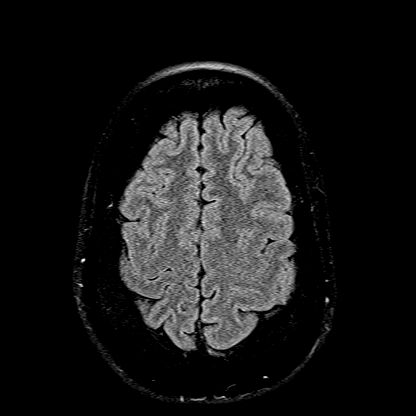
[im 55/55]
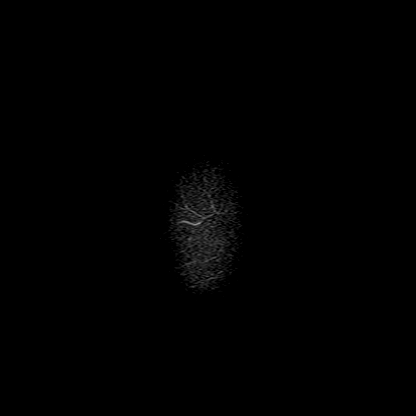

[Series 18: T2 · coronal · 3.0mm · 0.42mm/px · 1 of 13 slices shown (2 of 2)]
[im 1/13]
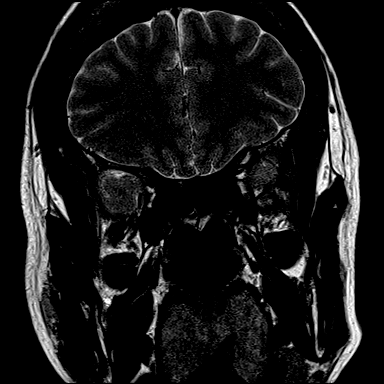

[Series 20: T1 post-contrast · coronal · 3.0mm · 0.28mm/px · 1 of 11 slices shown (1 of 9)]
[im 1/11]
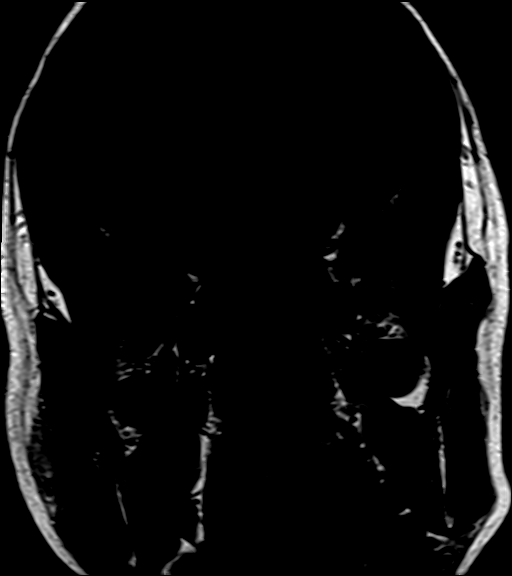

[Series 21: T1 post-contrast · coronal · 3.0mm · 0.28mm/px · 1 of 11 slices shown (2 of 9)]
[im 1/11]
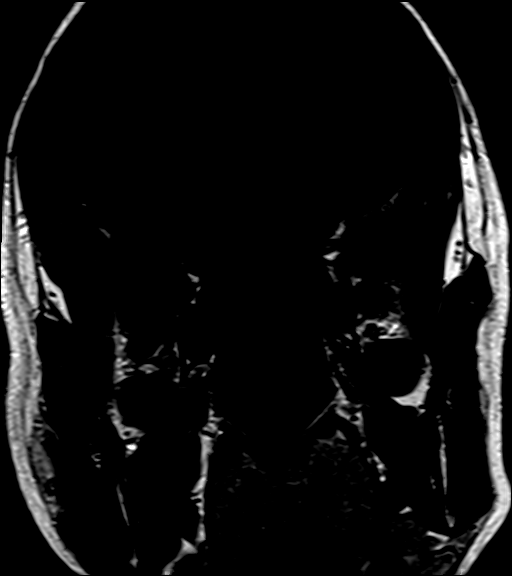

[Series 22: T1 post-contrast · coronal · 3.0mm · 0.28mm/px · 1 of 11 slices shown (3 of 9)]
[im 1/11]
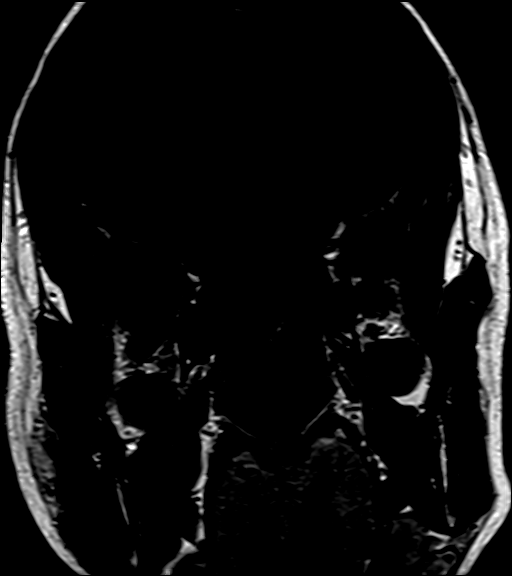

[Series 23: T1 post-contrast · coronal · 3.0mm · 0.28mm/px · 1 of 11 slices shown (4 of 9)]
[im 1/11]
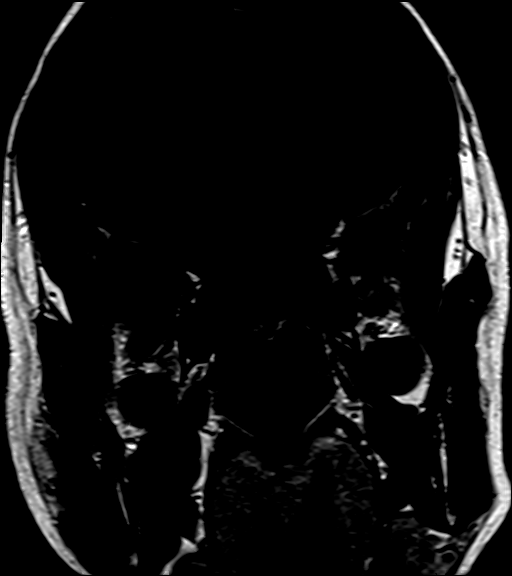

[Series 24: T1 post-contrast · coronal · 3.0mm · 0.28mm/px · 1 of 11 slices shown (5 of 9)]
[im 1/11]
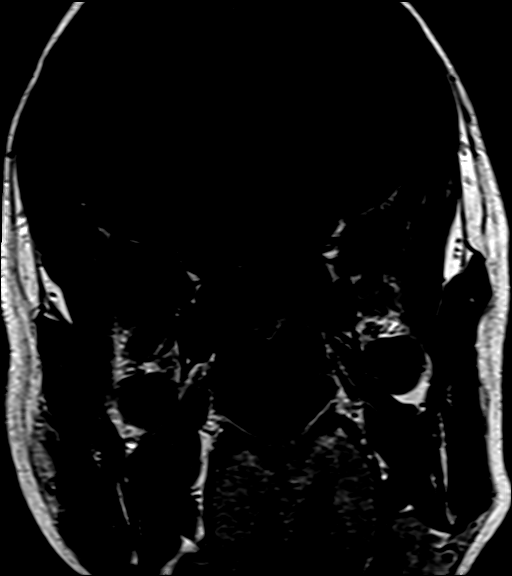

[Series 25: T1 post-contrast · coronal · 3.0mm · 0.28mm/px · 1 of 11 slices shown (6 of 9)]
[im 1/11]
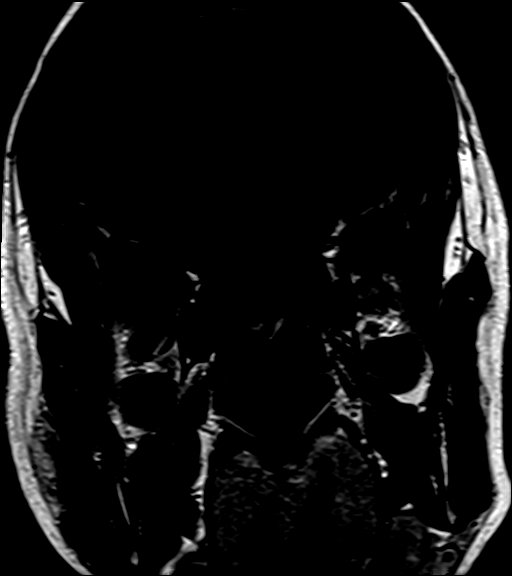

[Series 26: T1 post-contrast · coronal · 3.0mm · 0.21mm/px · 1 of 13 slices shown (7 of 9)]
[im 1/13]
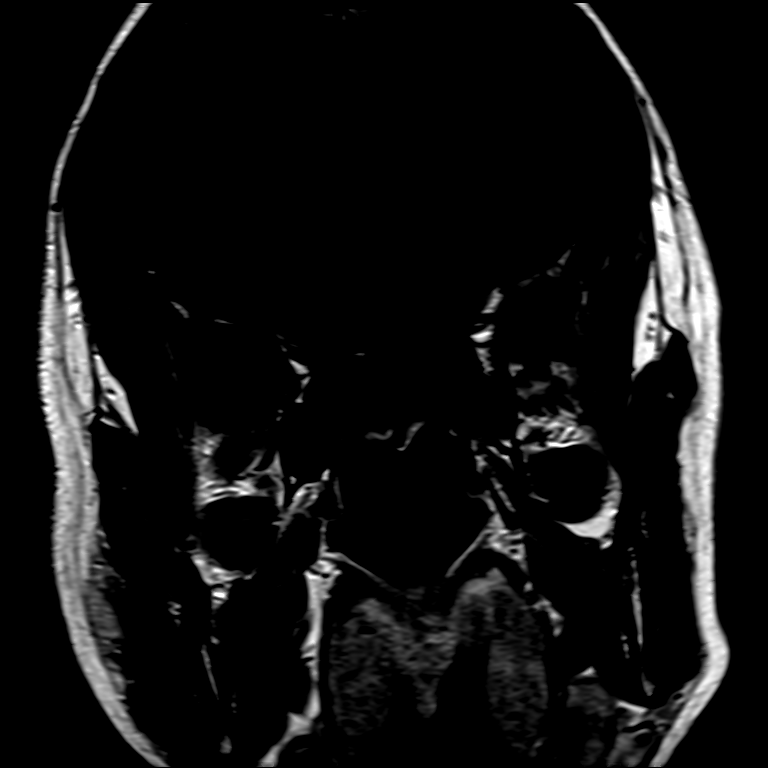

[Series 27: T1 post-contrast · sagittal · 3.0mm · 0.21mm/px · 1 of 13 slices shown (8 of 9)]
[im 1/13]
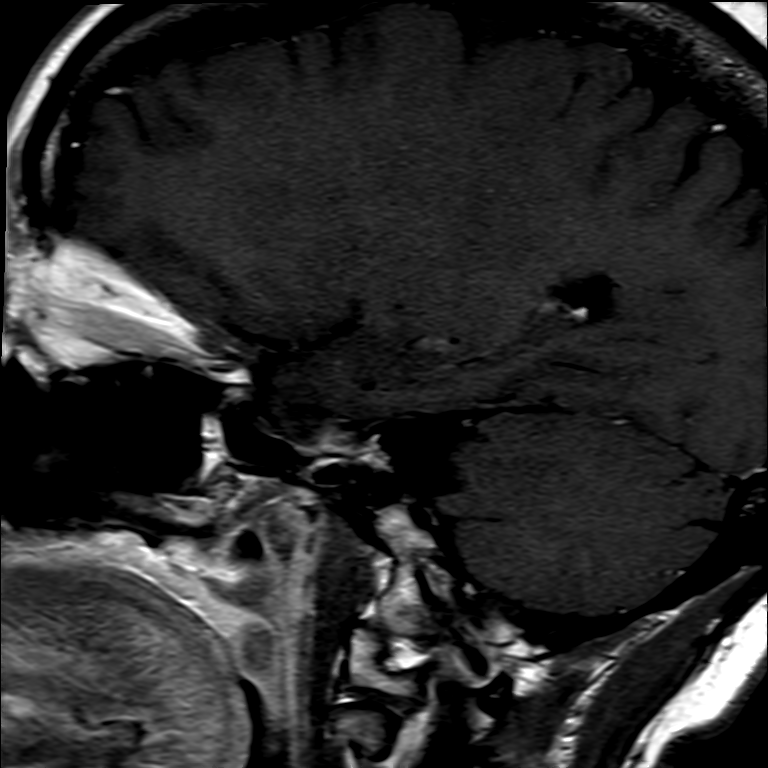

[Series 28: T1 post-contrast · axial · 1.0mm · 0.98mm/px · z∈[-127,+44]mm · 8 of 176 slices shown (9 of 9)]
[im 1/176]
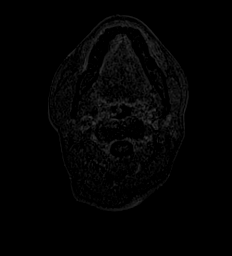
[im 26/176]
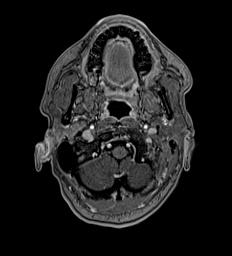
[im 51/176]
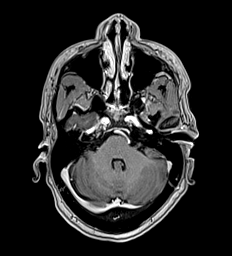
[im 76/176]
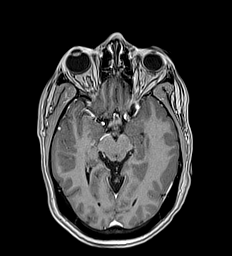
[im 101/176]
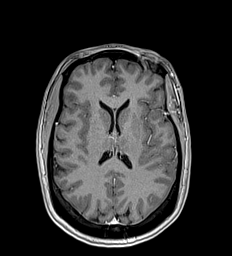
[im 126/176]
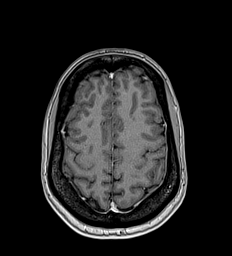
[im 151/176]
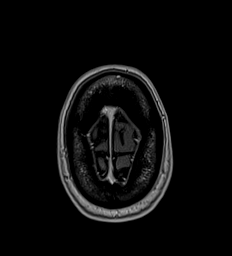
[im 176/176]
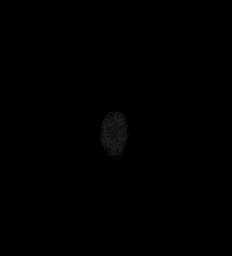

[24 of 48 positions shown; findings below may reference images not displayed]

FINDINGS: Brain: Cerebral volume is stable, within normal limits. Pituitary
details are below.

No restricted diffusion to suggest acute infarction. No midline
shift, mass effect, evidence of mass lesion, ventriculomegaly,
extra-axial collection or acute intracranial hemorrhage.
Cervicomedullary junction is within normal limits. Chronic cystic
change to the pineal gland (series 15, image 25) is stable, normal
variant. No suspicious pineal gland enhancement (series 27, image
7).

Gray and white matter signal remains normal. No encephalomalacia. No
chronic cerebral blood products. No abnormal gray or white matter
enhancement identified. No dural thickening.

Vascular: Major intracranial vascular flow voids are stable. There
is generalized intracranial artery tortuosity. See also intracranial
MRA today reported separately.

Skull and upper cervical spine: Visualized bone marrow signal is
within normal limits. Negative visible cervical spine.

Sinuses/Orbits: Negative orbits. Paranasal sinuses and mastoids are
stable and well aerated.

There is chronic FLAIR and T2 hyperintense signal in the right
petrous apex best seen on series 15, image 15. This has intermediate
T1 signal similar to last year, facilitated diffusion, and no
abnormal enhancement.

Otherwise bilateral petrous and mastoid air cells are clear. And
grossly normal visible other internal auditory structures.

No other skull base abnormality.

Negative visible scalp and face.

Other: Dedicated thin slice pituitary imaging. Stable pituitary size
and configuration since last year, within normal limits for age. No
suprasellar mass or mass effect. The infundibulum appears normal.
Normal hypothalamus. Cavernous sinuses are within normal limits.
Following contrast, pituitary enhancement is within normal limits on
both dynamic and delayed images.
IMPRESSION: 1. Chronic effusion of the right petrous apex, with no enhancement
or complicating features. This is likely postinflammatory and
clinical significance is doubtful.
2. Otherwise stable and normal MRI appearance of the Brain. Stable
and normal MRI appearance of the pituitary.
3. Intracranial MRA reported separately.

## 2020-12-25 MED ORDER — GADOBUTROL 1 MMOL/ML IV SOLN
7.0000 mL | Freq: Once | INTRAVENOUS | Status: AC | PRN
  Administered 2020-12-25: 7 mL via INTRAVENOUS

## 2020-12-26 DIAGNOSIS — R002 Palpitations: Secondary | ICD-10-CM | POA: Diagnosis not present

## 2020-12-31 ENCOUNTER — Other Ambulatory Visit (HOSPITAL_COMMUNITY): Payer: Self-pay

## 2021-01-01 ENCOUNTER — Other Ambulatory Visit (HOSPITAL_COMMUNITY): Payer: Self-pay

## 2021-01-05 ENCOUNTER — Other Ambulatory Visit (HOSPITAL_COMMUNITY): Payer: Self-pay

## 2021-01-05 MED ORDER — SUMATRIPTAN SUCCINATE 100 MG PO TABS
ORAL_TABLET | ORAL | 3 refills | Status: DC
  Filled 2021-01-05: qty 27, 90d supply, fill #0
  Filled 2021-12-29: qty 27, 90d supply, fill #1

## 2021-01-05 MED ORDER — ESCITALOPRAM OXALATE 10 MG PO TABS
10.0000 mg | ORAL_TABLET | Freq: Every day | ORAL | 3 refills | Status: DC
  Filled 2021-01-05: qty 90, 90d supply, fill #0
  Filled 2021-04-12: qty 90, 90d supply, fill #1
  Filled 2021-07-08: qty 90, 90d supply, fill #2
  Filled 2021-07-21: qty 90, 90d supply, fill #3

## 2021-01-06 ENCOUNTER — Other Ambulatory Visit (HOSPITAL_COMMUNITY): Payer: Self-pay

## 2021-01-06 MED ORDER — TOPIRAMATE 25 MG PO TABS
ORAL_TABLET | ORAL | 3 refills | Status: DC
  Filled 2021-01-06: qty 270, 90d supply, fill #0
  Filled 2021-05-21: qty 270, 90d supply, fill #1
  Filled 2021-10-02: qty 270, 90d supply, fill #2
  Filled 2021-12-29: qty 270, 90d supply, fill #3

## 2021-01-12 DIAGNOSIS — N924 Excessive bleeding in the premenopausal period: Secondary | ICD-10-CM | POA: Diagnosis not present

## 2021-01-12 DIAGNOSIS — N840 Polyp of corpus uteri: Secondary | ICD-10-CM | POA: Diagnosis not present

## 2021-01-29 DIAGNOSIS — N840 Polyp of corpus uteri: Secondary | ICD-10-CM | POA: Diagnosis not present

## 2021-01-29 DIAGNOSIS — N921 Excessive and frequent menstruation with irregular cycle: Secondary | ICD-10-CM | POA: Diagnosis not present

## 2021-01-29 DIAGNOSIS — Z01818 Encounter for other preprocedural examination: Secondary | ICD-10-CM | POA: Diagnosis not present

## 2021-01-29 DIAGNOSIS — N924 Excessive bleeding in the premenopausal period: Secondary | ICD-10-CM | POA: Diagnosis not present

## 2021-02-03 ENCOUNTER — Other Ambulatory Visit: Payer: Self-pay | Admitting: Obstetrics and Gynecology

## 2021-02-09 ENCOUNTER — Other Ambulatory Visit: Payer: Self-pay

## 2021-02-09 ENCOUNTER — Encounter
Admission: RE | Admit: 2021-02-09 | Discharge: 2021-02-09 | Disposition: A | Discharge status: Home / Self Care | Payer: 59 | Source: Ambulatory Visit | Attending: Surgery | Admitting: Surgery

## 2021-02-09 ENCOUNTER — Inpatient Hospital Stay: Admission: RE | Admit: 2021-02-09 | Payer: 59 | Source: Ambulatory Visit

## 2021-02-09 DIAGNOSIS — D649 Anemia, unspecified: Secondary | ICD-10-CM

## 2021-02-09 HISTORY — DX: Personal history of other diseases of the digestive system: Z87.19

## 2021-02-09 HISTORY — DX: Anemia, unspecified: D64.9

## 2021-02-09 HISTORY — DX: Benign neoplasm of pituitary gland: D35.2

## 2021-02-09 HISTORY — DX: Gastro-esophageal reflux disease without esophagitis: K21.9

## 2021-02-09 NOTE — Patient Instructions (Signed)
Your procedure is scheduled on:02-16-21 Monday Report to the Registration Desk on the 1st floor of the [facility].Then proceed to the [facility] Desk on the 2nd floor To find out your arrival time, please call [phone] between 1PM - 3PM on:02-13-21 Friday  REMEMBER: Instructions that are not followed completely may result in serious medical risk, up to and including death; or upon the discretion of your surgeon and anesthesiologist your surgery may need to be rescheduled.  Do not eat food after midnight the night before surgery.  No gum chewing, lozengers or hard candies.  You may however, drink CLEAR liquids up to 2 hours before you are scheduled to arrive for your surgery. Do not drink anything within 2 hours of your scheduled arrival time.  Clear liquids include: - water  - apple juice without pulp - gatorade (not RED, PURPLE, OR BLUE) - black coffee or tea (Do NOT add milk or creamers to the coffee or tea) Do NOT drink anything that is not on this list.  TAKE THESE MEDICATIONS THE MORNING OF SURGERY WITH A SIP OF WATER: -escitalopram (LEXAPRO) 10 MG tablet -topiramate (TOPAMAX) 25 MG tablet  One week prior to surgery: Stop Anti-inflammatories (NSAIDS) such as Advil, Aleve, Ibuprofen, Motrin, Naproxen, Naprosyn and Aspirin based products such as Excedrin, Goodys Powder, BC Powder. You may however, take Tylenol if needed for pain up until the day of surgery.  Stop ANY OVER THE COUNTER supplements/vitamins NOW (02-09-21) until after surgery (Alpha-Lipoic Acid 600 MG TABS)  No Alcohol for 24 hours before or after surgery.  No Smoking including e-cigarettes for 24 hours prior to surgery.  No chewable tobacco products for at least 6 hours prior to surgery.  No nicotine patches on the day of surgery.  Do not use any "recreational" drugs for at least a week prior to your surgery.  Please be advised that the combination of cocaine and anesthesia may have negative  outcomes, up to and including death. If you test positive for cocaine, your surgery will be cancelled.  On the morning of surgery brush your teeth with toothpaste and water, you may rinse your mouth with mouthwash if you wish. Do not swallow any toothpaste or mouthwash.  Do not wear jewelry, make-up, hairpins, clips or nail polish.  Do not wear lotions, powders, or perfumes.   Do not shave body from the neck down 48 hours prior to surgery just in case you cut yourself which could leave a site for infection.  Also, freshly shaved skin may become irritated if using the CHG soap.  Contact lenses, hearing aids and dentures may not be worn into surgery.  Do not bring valuables to the hospital. [facility] is not responsible for any missing/lost belongings or valuables.   Notify your doctor if there is any change in your medical condition (cold, fever, infection).  Wear comfortable clothing (specific to your surgery type) to the hospital.  After surgery, you can help prevent lung complications by doing breathing exercises.  Take deep breaths and cough every 1-2 hours. Your doctor may order a device called an Incentive Spirometer to help you take deep breaths. When coughing or sneezing, hold a pillow firmly against your incision with both hands. This is called "splinting." Doing this helps protect your incision. It also decreases belly discomfort.  If you are being admitted to the hospital overnight, leave your suitcase in the car. After surgery it may be brought to your room.  If you are being discharged  the day of surgery, you will not be allowed to drive home. You will need a responsible adult (18 years or older) to drive you home and stay with you that night.   If you are taking public transportation, you will need to have a responsible adult (18 years or older) with you. Please confirm with your physician that it is acceptable to use public transportation.   Please call the  [employer] [facility] at [phone] if you have any questions about these instructions.  Surgery Visitation Policy:  Patients undergoing a surgery or procedure may have one family member or support person with them as long as that person is not COVID-19 positive or experiencing its symptoms.  That person may remain in the waiting area during the procedure and may rotate out with other people.  Inpatient Visitation:    Visiting hours are 7 a.m. to 8 p.m. Up to two visitors ages 16+ are allowed at one time in a patient room. The visitors may rotate out with other people during the day. Visitors must check out when they leave, or other visitors will not be allowed. One designated support person may remain overnight. The visitor must pass COVID-19 screenings, use hand sanitizer when entering and exiting the patient's room and wear a mask at all times, including in the patient's room. Patients must also wear a mask when staff or their visitor are in the room. Masking is required regardless of vaccination status.

## 2021-02-11 ENCOUNTER — Other Ambulatory Visit (HOSPITAL_COMMUNITY): Payer: Self-pay

## 2021-02-11 DIAGNOSIS — D352 Benign neoplasm of pituitary gland: Secondary | ICD-10-CM | POA: Diagnosis not present

## 2021-02-11 DIAGNOSIS — H8110 Benign paroxysmal vertigo, unspecified ear: Secondary | ICD-10-CM | POA: Diagnosis not present

## 2021-02-11 DIAGNOSIS — G43719 Chronic migraine without aura, intractable, without status migrainosus: Secondary | ICD-10-CM | POA: Diagnosis not present

## 2021-02-11 DIAGNOSIS — R202 Paresthesia of skin: Secondary | ICD-10-CM | POA: Diagnosis not present

## 2021-02-11 MED ORDER — EMGALITY 120 MG/ML ~~LOC~~ SOAJ
SUBCUTANEOUS | 0 refills | Status: DC
  Filled 2021-02-11: qty 2, 28d supply, fill #0

## 2021-02-11 MED ORDER — EMGALITY 120 MG/ML ~~LOC~~ SOAJ: SUBCUTANEOUS | 11 refills | Status: DC

## 2021-02-11 NOTE — H&P (Signed)
Kathleen Diaz is a 55 y.o. female here for discuss hysteroscopy and D&C     Referring provider: Genice Rouge*   History of Present Illness: New pt presents today to discuss D&C. She had been seeing Dr. Lurena Nida at [facility] for her AUB. Endometrial biopsy showed fragments of benign polyp and she would like to do her D&C closer to home and is here for surgical consult.    Today: Periods have been heavy and lasting 2-3 weeks for like 12 months now. Went to [facility] and it showed polyp. She was going to have hysteroscopy on 02/26/2021 and because it was all the way in [place] she wants to have it done here instead.    11/2020 EMBx Benign endometrial fragments. Fragments of endometrial polyp.   Pertinent Hx: - 3 SVDs - hx of fibroids  - stage 2 pelvic organ prolapse  - hx of CIN 3 s/p LEEP 2005              -CIN 1 2014, pap 2020 normal    Past Medical History:  has a past medical history of Fibroids and Migraines.  Past Surgical History:  has a past surgical history that includes Breast surgery (Bilateral, 03/29/1990); pituitary surgery; wrist surgery (11/27/1988); Transphenoidal / transnasal hypophysectomy / resection pituitary tumor (03/29/1998); Colonoscopy (03/17/2020); and egd (03/17/2020). Family History: family history includes Diabetes in her mother; High blood pressure (Hypertension) in her mother; No Known Problems in her father. Social History:  reports that she has never smoked. She has never used smokeless tobacco. She reports that she does not drink alcohol and does not use drugs. OB/GYN History:  OB History     Gravida 4   Para 3   Term     Preterm     AB     Living       SAB     IAB     Ectopic     Molar     Multiple     Live Births          Allergies: is allergic to latex. Medications: Current Outpatient Medications:    escitalopram oxalate (LEXAPRO) 10 MG tablet, Take 1 tablet (10 mg total) by mouth once daily, Disp: 90  tablet, Rfl: 3   eszopiclone (LUNESTA) 3 mg tablet, Take 1 tablet (3 mg total) by mouth at bedtime as needed for Sleep for up to 90 days, Disp: 90 tablet, Rfl: 0   gabapentin (NEURONTIN) 300 MG capsule, Take 1 capsule (300 mg total) by mouth 3 (three) times daily as needed (sciatica pain) for up to 270 days, Disp: 270 capsule, Rfl: 2   SUMAtriptan (IMITREX) 100 MG tablet, Take 1 tablet (100 mg total) by mouth once daily as needed for Migraine (repeat once after 2 hrs if headache persists), Disp: 27 tablet, Rfl: 3   topiramate (TOPAMAX) 25 MG tablet, Take 3 tablets (75 mg total) by mouth once daily, Disp: 270 tablet, Rfl: 3   medroxyPROGESTERone (PROVERA) 10 MG tablet, Take 1 tablet (10 mg total) by mouth 2 (two) times daily for 10 days, Disp: 20 tablet, Rfl: 0   Review of Systems: No SOB, no palpitations or chest pain, no new lower extremity edema, no nausea or vomiting or bowel or bladder complaints. See HPI for gyn specific ROS.    Exam:   BP 131/88   Pulse 89   Ht 182.9 cm (6' 0.01")   Wt 79.7 kg (175 lb 12.8 oz)   LMP 12/22/2020  BMI 23.84 kg/m     Constitutional:  General appearance: Well nourished, well developed female in no acute distress.  Neuro/psych:  Normal mood and affect. No gross motor deficits. Neck:  Supple, normal appearance.  Respiratory:  Normal respiratory effort, no use of accessory muscles Skin:  No visible rashes or external lesions  Pelvic: deferred    Impression:   The primary encounter diagnosis was Menometrorrhagia. Diagnoses of Preop examination, Abnormal perimenopausal bleeding, Endometrial polyp, and Family history of uterine fibroid were also pertinent to this visit.   Plan:   - Preop D&C, AUB, Endometrial Polyp, Uterine Fibroids   Planned procedure: Fractional D&C, hysteroscopy, myosure polypectomy, possible submucosal myomectomy   -Risks of surgery were discussed with the patient including but not limited to: bleeding which may require  transfusion; infection which may require antibiotics; injury to uterus or surrounding organs; intrauterine scarring which may impair future fertility; need for additional procedures including laparotomy or laparoscopy; and other postoperative/anesthesia complications. Written informed consent was obtained.   This is a scheduled same-day surgery. She will have a postop visit 2 weeks following procedure to review operative findings and pathology.     Diagnoses and all orders for this visit:   Menometrorrhagia   Preop examination   Abnormal perimenopausal bleeding   Endometrial polyp   Family history of uterine fibroid

## 2021-02-13 ENCOUNTER — Other Ambulatory Visit: Payer: Self-pay

## 2021-02-13 ENCOUNTER — Encounter
Admission: RE | Admit: 2021-02-13 | Discharge: 2021-02-13 | Disposition: A | Discharge status: Home / Self Care | Payer: 59 | Source: Ambulatory Visit | Attending: Obstetrics and Gynecology | Admitting: Obstetrics and Gynecology

## 2021-02-13 DIAGNOSIS — N950 Postmenopausal bleeding: Secondary | ICD-10-CM | POA: Diagnosis not present

## 2021-02-13 DIAGNOSIS — D649 Anemia, unspecified: Secondary | ICD-10-CM | POA: Insufficient documentation

## 2021-02-13 DIAGNOSIS — Z01812 Encounter for preprocedural laboratory examination: Secondary | ICD-10-CM | POA: Insufficient documentation

## 2021-02-13 LAB — CBC
HCT: 38.3 % (ref 36.0–46.0)
Hemoglobin: 12.5 g/dL (ref 12.0–15.0)
MCH: 31.2 pg (ref 26.0–34.0)
MCHC: 32.6 g/dL (ref 30.0–36.0)
MCV: 95.5 fL (ref 80.0–100.0)
Platelets: 253 10*3/uL (ref 150–400)
RBC: 4.01 MIL/uL (ref 3.87–5.11)
RDW: 12.4 % (ref 11.5–15.5)
WBC: 3.8 10*3/uL — ABNORMAL LOW (ref 4.0–10.5)
nRBC: 0 % (ref 0.0–0.2)

## 2021-02-13 LAB — TYPE AND SCREEN
ABO/RH(D): O POS
Antibody Screen: NEGATIVE

## 2021-02-16 ENCOUNTER — Other Ambulatory Visit (HOSPITAL_COMMUNITY): Payer: Self-pay

## 2021-02-16 ENCOUNTER — Encounter: Payer: Self-pay | Admitting: Obstetrics and Gynecology

## 2021-02-16 ENCOUNTER — Encounter
Admission: RE | Disposition: A | Discharge status: Home / Self Care | Payer: Self-pay | Source: Home / Self Care | Attending: Obstetrics and Gynecology

## 2021-02-16 ENCOUNTER — Ambulatory Visit
Admission: RE | Admit: 2021-02-16 | Discharge: 2021-02-16 | Disposition: A | Discharge status: Home / Self Care | Payer: 59 | Attending: Obstetrics and Gynecology | Admitting: Obstetrics and Gynecology

## 2021-02-16 ENCOUNTER — Ambulatory Visit: Payer: 59 | Admitting: Urgent Care

## 2021-02-16 DIAGNOSIS — N924 Excessive bleeding in the premenopausal period: Secondary | ICD-10-CM | POA: Diagnosis not present

## 2021-02-16 DIAGNOSIS — D259 Leiomyoma of uterus, unspecified: Secondary | ICD-10-CM | POA: Diagnosis not present

## 2021-02-16 DIAGNOSIS — N921 Excessive and frequent menstruation with irregular cycle: Secondary | ICD-10-CM | POA: Diagnosis present

## 2021-02-16 DIAGNOSIS — N920 Excessive and frequent menstruation with regular cycle: Secondary | ICD-10-CM | POA: Diagnosis not present

## 2021-02-16 DIAGNOSIS — N939 Abnormal uterine and vaginal bleeding, unspecified: Secondary | ICD-10-CM | POA: Diagnosis not present

## 2021-02-16 DIAGNOSIS — D250 Submucous leiomyoma of uterus: Secondary | ICD-10-CM | POA: Diagnosis not present

## 2021-02-16 DIAGNOSIS — N950 Postmenopausal bleeding: Secondary | ICD-10-CM

## 2021-02-16 DIAGNOSIS — N840 Polyp of corpus uteri: Secondary | ICD-10-CM | POA: Insufficient documentation

## 2021-02-16 HISTORY — PX: HYSTEROSCOPY WITH D & C: SHX1775

## 2021-02-16 HISTORY — PX: DILATATION & CURETTAGE/HYSTEROSCOPY WITH MYOSURE: SHX6511

## 2021-02-16 LAB — POCT PREGNANCY, URINE: Preg Test, Ur: NEGATIVE

## 2021-02-16 SURGERY — DILATATION AND CURETTAGE /HYSTEROSCOPY
Anesthesia: General

## 2021-02-16 MED ORDER — FENTANYL CITRATE (PF) 100 MCG/2ML IJ SOLN
25.0000 ug | INTRAMUSCULAR | Status: DC | PRN
  Administered 2021-02-16: 25 ug via INTRAVENOUS

## 2021-02-16 MED ORDER — EPHEDRINE 5 MG/ML INJ
INTRAVENOUS | Status: AC
  Filled 2021-02-16: qty 5

## 2021-02-16 MED ORDER — SODIUM CHLORIDE 0.9 % IR SOLN
Status: DC | PRN
  Administered 2021-02-16: 2397 mL

## 2021-02-16 MED ORDER — APREPITANT 40 MG PO CAPS: 40.0000 mg | ORAL_CAPSULE | Freq: Once | ORAL | Status: AC

## 2021-02-16 MED ORDER — FENTANYL CITRATE (PF) 100 MCG/2ML IJ SOLN
INTRAMUSCULAR | Status: AC
  Filled 2021-02-16: qty 2

## 2021-02-16 MED ORDER — LIDOCAINE HCL (CARDIAC) PF 100 MG/5ML IV SOSY
PREFILLED_SYRINGE | INTRAVENOUS | Status: DC | PRN
  Administered 2021-02-16: 60 mg via INTRAVENOUS

## 2021-02-16 MED ORDER — EPHEDRINE SULFATE 50 MG/ML IJ SOLN
INTRAMUSCULAR | Status: DC | PRN
  Administered 2021-02-16: 10 mg via INTRAVENOUS
  Administered 2021-02-16 (×3): 5 mg via INTRAVENOUS

## 2021-02-16 MED ORDER — ONDANSETRON HCL 4 MG/2ML IJ SOLN
INTRAMUSCULAR | Status: DC | PRN
  Administered 2021-02-16: 4 mg via INTRAVENOUS

## 2021-02-16 MED ORDER — ONDANSETRON HCL 4 MG/2ML IJ SOLN: 4.0000 mg | Freq: Once | INTRAMUSCULAR | Status: DC | PRN

## 2021-02-16 MED ORDER — MIDAZOLAM HCL 2 MG/2ML IJ SOLN
INTRAMUSCULAR | Status: AC
  Filled 2021-02-16: qty 2

## 2021-02-16 MED ORDER — PROPOFOL 10 MG/ML IV BOLUS
INTRAVENOUS | Status: DC | PRN
  Administered 2021-02-16: 170 mg via INTRAVENOUS

## 2021-02-16 MED ORDER — FENTANYL CITRATE (PF) 100 MCG/2ML IJ SOLN
INTRAMUSCULAR | Status: DC | PRN
  Administered 2021-02-16 (×3): 25 ug via INTRAVENOUS

## 2021-02-16 MED ORDER — DEXMEDETOMIDINE (PRECEDEX) IN NS 20 MCG/5ML (4 MCG/ML) IV SYRINGE
PREFILLED_SYRINGE | INTRAVENOUS | Status: DC | PRN
  Administered 2021-02-16: 4 ug via INTRAVENOUS

## 2021-02-16 MED ORDER — ACETAMINOPHEN 500 MG PO TABS: 1000.0000 mg | ORAL_TABLET | ORAL | Status: AC

## 2021-02-16 MED ORDER — APREPITANT 40 MG PO CAPS
ORAL_CAPSULE | ORAL | Status: AC
  Administered 2021-02-16: 40 mg via ORAL
  Filled 2021-02-16: qty 1

## 2021-02-16 MED ORDER — ORAL CARE MOUTH RINSE: 15.0000 mL | Freq: Once | OROMUCOSAL | Status: AC

## 2021-02-16 MED ORDER — IBUPROFEN 800 MG PO TABS
800.0000 mg | ORAL_TABLET | Freq: Three times a day (TID) | ORAL | 1 refills | Status: DC | PRN
  Filled 2021-02-16: qty 30, 10d supply, fill #0

## 2021-02-16 MED ORDER — DEXMEDETOMIDINE (PRECEDEX) IN NS 20 MCG/5ML (4 MCG/ML) IV SYRINGE
PREFILLED_SYRINGE | INTRAVENOUS | Status: AC
  Filled 2021-02-16: qty 5

## 2021-02-16 MED ORDER — FENTANYL CITRATE (PF) 100 MCG/2ML IJ SOLN
INTRAMUSCULAR | Status: AC
  Administered 2021-02-16: 25 ug via INTRAVENOUS
  Filled 2021-02-16: qty 2

## 2021-02-16 MED ORDER — LACTATED RINGERS IV SOLN: INTRAVENOUS | Status: DC

## 2021-02-16 MED ORDER — 0.9 % SODIUM CHLORIDE (POUR BTL) OPTIME
TOPICAL | Status: DC | PRN
  Administered 2021-02-16: 500 mL

## 2021-02-16 MED ORDER — CHLORHEXIDINE GLUCONATE 0.12 % MT SOLN
OROMUCOSAL | Status: AC
  Administered 2021-02-16: 15 mL via OROMUCOSAL
  Filled 2021-02-16: qty 15

## 2021-02-16 MED ORDER — SILVER NITRATE-POT NITRATE 75-25 % EX MISC
CUTANEOUS | Status: DC | PRN
  Administered 2021-02-16: 2

## 2021-02-16 MED ORDER — ACETAMINOPHEN 500 MG PO TABS
ORAL_TABLET | ORAL | Status: AC
  Administered 2021-02-16: 1000 mg via ORAL
  Filled 2021-02-16: qty 2

## 2021-02-16 MED ORDER — DEXAMETHASONE SODIUM PHOSPHATE 10 MG/ML IJ SOLN
INTRAMUSCULAR | Status: DC | PRN
  Administered 2021-02-16: 10 mg via INTRAVENOUS

## 2021-02-16 MED ORDER — MIDAZOLAM HCL 2 MG/2ML IJ SOLN
INTRAMUSCULAR | Status: DC | PRN
  Administered 2021-02-16: 2 mg via INTRAVENOUS

## 2021-02-16 MED ORDER — ONDANSETRON 4 MG PO TBDP
4.0000 mg | ORAL_TABLET | Freq: Four times a day (QID) | ORAL | 0 refills | Status: DC | PRN
  Filled 2021-02-16: qty 20, 5d supply, fill #0

## 2021-02-16 MED ORDER — KETOROLAC TROMETHAMINE 30 MG/ML IJ SOLN
INTRAMUSCULAR | Status: DC | PRN
  Administered 2021-02-16: 30 mg via INTRAVENOUS

## 2021-02-16 MED ORDER — FAMOTIDINE 20 MG PO TABS
ORAL_TABLET | ORAL | Status: AC
  Administered 2021-02-16: 20 mg via ORAL
  Filled 2021-02-16: qty 1

## 2021-02-16 MED ORDER — CHLORHEXIDINE GLUCONATE 0.12 % MT SOLN: 15.0000 mL | Freq: Once | OROMUCOSAL | Status: AC

## 2021-02-16 MED ORDER — PROPOFOL 10 MG/ML IV BOLUS
INTRAVENOUS | Status: AC
  Filled 2021-02-16: qty 20

## 2021-02-16 MED ORDER — FAMOTIDINE 20 MG PO TABS: 20.0000 mg | ORAL_TABLET | Freq: Once | ORAL | Status: AC

## 2021-02-16 MED ORDER — POVIDONE-IODINE 10 % EX SWAB: 2.0000 "application " | Freq: Once | CUTANEOUS | Status: DC

## 2021-02-16 SURGICAL SUPPLY — 22 items
BAG INFUSER PRESSURE 100CC (MISCELLANEOUS) IMPLANT
CANISTER SUC SOCK COL 7IN (MISCELLANEOUS) IMPLANT
DEVICE MYOSURE LITE (MISCELLANEOUS) IMPLANT
DEVICE MYOSURE REACH (MISCELLANEOUS) ×2 IMPLANT
ELECT REM PT RETURN 9FT ADLT (ELECTROSURGICAL) ×2
ELECTRODE REM PT RTRN 9FT ADLT (ELECTROSURGICAL) ×1 IMPLANT
GAUZE 4X4 16PLY ~~LOC~~+RFID DBL (SPONGE) ×4 IMPLANT
GLOVE SURG ENC MOIS LTX SZ7 (GLOVE) ×2 IMPLANT
GLOVE SURG UNDER LTX SZ7.5 (GLOVE) ×2 IMPLANT
GOWN STRL REUS W/ TWL LRG LVL3 (GOWN DISPOSABLE) ×2 IMPLANT
GOWN STRL REUS W/TWL LRG LVL3 (GOWN DISPOSABLE) ×4
IV NS IRRIG 3000ML ARTHROMATIC (IV SOLUTION) ×2 IMPLANT
KIT PROCEDURE FLUENT (KITS) ×2 IMPLANT
KIT TURNOVER CYSTO (KITS) ×2 IMPLANT
MANIFOLD NEPTUNE II (INSTRUMENTS) IMPLANT
PACK DNC HYST (MISCELLANEOUS) ×2 IMPLANT
PAD OB MATERNITY 4.3X12.25 (PERSONAL CARE ITEMS) ×2 IMPLANT
PAD PREP 24X41 OB/GYN DISP (PERSONAL CARE ITEMS) ×2 IMPLANT
SCRUB EXIDINE 4% CHG 4OZ (MISCELLANEOUS) ×2 IMPLANT
SEAL ROD LENS SCOPE MYOSURE (ABLATOR) ×2 IMPLANT
TUBING CONNECTING 10 (TUBING) ×2 IMPLANT
WATER STERILE IRR 500ML POUR (IV SOLUTION) IMPLANT

## 2021-02-16 NOTE — Anesthesia Preprocedure Evaluation (Signed)
Anesthesia Evaluation  Patient identified by MRN, date of birth, ID band Patient awake    Reviewed: Allergy & Precautions, NPO status , Patient's Chart, lab work & pertinent test results  Airway Mallampati: II  TM Distance: >3 FB Neck ROM: full    Dental  (+) Teeth Intact   Pulmonary neg pulmonary ROS,    Pulmonary exam normal breath sounds clear to auscultation       Cardiovascular Exercise Tolerance: Good negative cardio ROS Normal cardiovascular exam Rhythm:Regular Rate:Normal     Neuro/Psych  Headaches, negative neurological ROS  negative psych ROS   GI/Hepatic negative GI ROS, Neg liver ROS, GERD  ,  Endo/Other  negative endocrine ROS  Renal/GU negative Renal ROS  negative genitourinary   Musculoskeletal   Abdominal Normal abdominal exam  (+)   Peds negative pediatric ROS (+)  Hematology negative hematology ROS (+) Blood dyscrasia, anemia ,   Anesthesia Other Findings Past Medical History: No date: Anemia No date: Chronic bilateral low back pain with left-sided sciatica No date: DDD (degenerative disc disease), lumbar No date: External hemorrhoids No date: GERD (gastroesophageal reflux disease) No date: History of hiatal hernia     Comment:  small No date: Hyperprolactinemia (HCC) No date: Intervertebral disc disorder with radiculopathy of lumbar  region No date: Migraine with aura and without status migrainosus No date: Migraines No date: New daily persistent headache No date: Paresthesia of hand, bilateral No date: Paresthesia of left foot No date: Pituitary adenoma ([place]) No date: Prehypertension No date: S/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  Past Surgical History: No date: BREAST EXCISIONAL BIOPSY; Bilateral     Comment:  age 55 03/17/2020: COLONOSCOPY WITH PROPOFOL; N/A     Comment:  Procedure: COLONOSCOPY WITH PROPOFOL;  Surgeon: Toledo,                [name], MD;  Location: [facility] ENDOSCOPY;  Service:               Gastroenterology;  Laterality: N/A; No date: DE QUERVAIN'S RELEASE; Bilateral 03/17/2020: ESOPHAGOGASTRODUODENOSCOPY (EGD) WITH PROPOFOL; N/A     Comment:  Procedure: ESOPHAGOGASTRODUODENOSCOPY (EGD) WITH               PROPOFOL;  Surgeon: Toledo, [name], MD;  Location:               [facility] ENDOSCOPY;  Service: Gastroenterology;  Laterality:               N/A; 2000: PITUITARY SURGERY     Comment:  resection     Reproductive/Obstetrics negative OB ROS                             Anesthesia Physical Anesthesia Plan  ASA: 2  Anesthesia Plan: General   Post-op Pain Management:    Induction: Intravenous  PONV Risk Score and Plan: 1 and Ondansetron  Airway Management Planned: LMA  Additional Equipment:   Intra-op Plan:   Post-operative Plan: Extubation in OR  Informed Consent: I have reviewed the patients History and Physical, chart, labs and discussed the procedure including the risks, benefits and alternatives for the proposed anesthesia with the patient or authorized representative who has indicated his/her understanding and acceptance.     Dental Advisory Given  Plan Discussed with: CRNA and Surgeon  Anesthesia Plan Comments:         Anesthesia Quick Evaluation

## 2021-02-16 NOTE — Anesthesia Procedure Notes (Signed)
Procedure Name: LMA Insertion Date/Time: 02/16/2021 3:36 PM Performed by: Debe Coder, CRNA Pre-anesthesia Checklist: Patient identified, Emergency Drugs available, Suction available and Patient being monitored Patient Re-evaluated:Patient Re-evaluated prior to induction Oxygen Delivery Method: Circle system utilized Preoxygenation: Pre-oxygenation with 100% oxygen Induction Type: IV induction Ventilation: Mask ventilation without difficulty LMA: LMA inserted LMA Size: 5.0 Number of attempts: 1 Placement Confirmation: positive ETCO2 and breath sounds checked- equal and bilateral Tube secured with: Tape Dental Injury: Teeth and Oropharynx as per pre-operative assessment

## 2021-02-16 NOTE — Discharge Instructions (Addendum)
Discharge instructions after a hysteroscopy with dilation and curettage ° °Signs and Symptoms to Report ° °Call our office at [phone] if you have any of the following:  ° ° Fever over 100.4 degrees or higher ° Severe stomach pain not relieved with pain medications ° Bright red bleeding that’s heavier than a period that does not slow with rest after the first 24 hours ° To go the bathroom a lot (frequency), you can’t hold your urine (urgency), or it hurts when you empty your bladder (urinate) ° Chest pain ° Shortness of breath ° Pain in the calves of your legs ° Severe nausea and vomiting not relieved with anti-nausea medications ° Any concerns ° °What You Can Expect after Surgery ° You may see some pink tinged, bloody fluid. This is normal. You may also have cramping for several days.  ° °Activities after Your Discharge °Follow these guidelines to help speed your recovery at home: ° Don’t drive if you are in pain or taking narcotic pain medicine. You may drive when you can safely slam on the brakes, turn the wheel forcefully, and rotate your torso comfortably. This is typically 4-7 days. Practice in a parking lot or side street prior to attempting to drive regularly.  ° Ask others to help with household chores for 4 weeks. ° Don’t do strenuous activities, exercises, or sports like vacuuming, tennis, squash, etc. until your doctor says it is safe to do so. ° Walk as you feel able. Rest often since it may take a week or two for your energy level to return to normal.  ° You may climb stairs ° Avoid constipation: °  -Eat fruits, vegetables, and whole grains. Eat small meals as your appetite will take time to return to normal. °  -Drink 6 to 8 glasses of water each day unless your doctor has told you to limit your fluids. °  -Use a laxative or stool softener as needed if constipation becomes a problem. You may take Miralax, metamucil, Citrucil, Colace, Senekot, FiberCon, etc. If this does not relieve the  constipation, try two tablespoons of Milk Of Magnesia every 8 hours until your bowels move.  ° You may shower.  ° Do not get in a hot tub, swimming pool, etc. until your doctor agrees. ° Do not douche, use tampons, or have sex until your doctor says it is okay, usually about 2 weeks. ° Take your pain medicine when you need it. The medicine may not work as well if the pain is bad. ° °Take the medicines you were taking before surgery. Other medications you might need are pain medications (ibuprofen), medications for constipation (Colace) and nausea medications (Zofran).  ° ° AMBULATORY SURGERY  °DISCHARGE INSTRUCTIONS ° ° °The drugs that you were given will stay in your system until tomorrow so for the next 24 hours you should not: ° °Drive an automobile °Make any legal decisions °Drink any alcoholic beverage ° ° °You may resume regular meals tomorrow.  Today it is better to start with liquids and gradually work up to solid foods. ° °You may eat anything you prefer, but it is better to start with liquids, then soup and crackers, and gradually work up to solid foods. ° ° °Please notify your doctor immediately if you have any unusual bleeding, trouble breathing, redness and pain at the surgery site, drainage, fever, or pain not relieved by medication. °  ° °Your post-operative visit with Dr.                     ° ° °           °       is: Date:                        Time:   ° °Please call to schedule your post-operative visit. ° °Additional Instructions:  °

## 2021-02-16 NOTE — Transfer of Care (Signed)
Immediate Anesthesia Transfer of Care Note  Patient: Kathleen Diaz  Procedure(s) Performed: DILATATION AND CURETTAGE /HYSTEROSCOPY DILATATION & CURETTAGE/HYSTEROSCOPY WITH [place] - POLYPECTOMY POSSIBLE MYOMECTOMY  Patient Location: PACU  Anesthesia Type:General  Level of Consciousness: awake, drowsy and patient cooperative  Airway & Oxygen Therapy: Patient Spontanous Breathing and Patient connected to face mask oxygen  Post-op Assessment: Report given to RN and Post -op Vital signs reviewed and stable  Post vital signs: Reviewed and stable  Last Vitals:  Vitals Value Taken Time  BP 114/77 02/16/21 1634  Temp    Pulse 74 02/16/21 1639  Resp 10 02/16/21 1639  SpO2 100 % 02/16/21 1639    Last Pain:  Vitals:   02/16/21 1458  TempSrc: Temporal  PainSc: 0-No pain         Complications: No notable events documented.

## 2021-02-16 NOTE — Op Note (Signed)
Operative Report Hysteroscopy with Dilation and Curettage   Indications: Menometrorrhagia  Pre-operative Diagnosis:  Submucosal fibroid    Post-operative Diagnosis: same.  Procedure: 1. Exam under anesthesia 2. Fractional D&C 3. Hysteroscopy 4.  MyoSure submucosal myomectomy  Surgeon: Benjaman Kindler, MD  Assistant(s):  None  Anesthesia: General LMA anesthesia  Anesthesiologist: Molli Barrows, MD Anesthesiologist: Molli Barrows, MD CRNA: Debe Coder, CRNA  Estimated Blood Loss:  Minimal         Intraoperative medications:  toradol         Total IV Fluids: 61ml  Urine Output: 288ml  Total Fluid Deficit:  300 mL          Specimens: Endocervical curettings, endometrial curettings, myosure specimen         Complications:  None; patient tolerated the procedure well.         Disposition: PACU - hemodynamically stable.         Condition: stable  Findings: Uterus measuring 9 cm by sound; normal cervix, vagina, perineum.  2 left sided broad-based fibroids.  Otherwise fairly atrophic endometrium, with 1 or 2 areas that may have been mildly proliferative, but not diffusely proliferative.  Normal endocervical canal.  Indication for procedure/Consents: 55 y.o. F   here for scheduled surgery for the aforementioned diagnoses.   Risks of surgery were discussed with the patient including but not limited to: bleeding which may require transfusion; infection which may require antibiotics; injury to uterus or surrounding organs; intrauterine scarring which may impair future fertility; need for additional procedures including laparotomy or laparoscopy; and other postoperative/anesthesia complications. Written informed consent was obtained.    Procedure Details:  D&C/ Myosure  The patient was taken to the operating room where anesthesia was administered and was found to be adequate.  After a formal and adequate timeout was performed, she was placed in the dorsal lithotomy position  and examined with the above findings. She was then prepped and draped in the sterile manner.   Her bladder was catheterized for an estimated amount of clear, yellow urine. A weighed speculum was then placed in the patient's vagina and a single tooth tenaculum was applied to the anterior lip of the cervix.An ECC was performed  Her cervix was serially dilated to 15 [place] using Hanks dilators. The hysteroscope was introduced under direct observation  Using lactated ringers as a distention medium to reveal the above findings. The uterine cavity was carefully examined, both ostia were recognized, and the fibroids above were noted. These were resected using the Myosure device for a total of about 7 minutes. After further careful visualization of the uterine cavity, the hysteroscope was removed under direct visualization.  The tenaculum was removed from the anterior lip of the cervix and the vaginal speculum was removed after applying silver nitrate for good hemostasis.   The patient tolerated the procedure well and was taken to the recovery area awake and in stable condition. She received iv acetaminophen and Toradol prior to leaving the OR.  The patient will be discharged to home as per PACU criteria. Routine postoperative instructions given.  She was prescribed Ibuprofen and Colace.  She will follow up in the clinic in two weeks for postoperative evaluation.

## 2021-02-16 NOTE — Interval H&P Note (Signed)
History and Physical Interval Note:  02/16/2021 3:12 PM  Kathleen Diaz  has presented today for surgery, with the diagnosis of endometrial polyp, AUB, menorrhagia, fibroids.  The various methods of treatment have been discussed with the patient and family. After consideration of risks, benefits and other options for treatment, the patient has consented to  Procedure(s): DILATATION AND CURETTAGE /HYSTEROSCOPY (N/A) [place] - POLYPECTOMY POSSIBLE MYOMECTOMY (N/A) as a surgical intervention.  The patient's history has been reviewed, patient examined, no change in status, stable for surgery.  I have reviewed the patient's chart and labs.  Questions were answered to the patient's satisfaction.     Benjaman Kindler

## 2021-02-17 ENCOUNTER — Encounter: Payer: Self-pay | Admitting: Obstetrics and Gynecology

## 2021-02-17 ENCOUNTER — Other Ambulatory Visit (HOSPITAL_COMMUNITY): Payer: Self-pay

## 2021-02-18 ENCOUNTER — Other Ambulatory Visit (HOSPITAL_COMMUNITY): Payer: Self-pay

## 2021-02-18 LAB — SURGICAL PATHOLOGY

## 2021-02-24 NOTE — Anesthesia Postprocedure Evaluation (Signed)
Anesthesia Post Note  Patient: Lorraina Spring  Procedure(s) Performed: DILATATION AND CURETTAGE /HYSTEROSCOPY DILATATION & CURETTAGE/HYSTEROSCOPY WITH [place] - MYOMECTOMY  Patient location during evaluation: PACU Anesthesia Type: General Level of consciousness: awake and alert Pain management: pain level controlled Vital Signs Assessment: post-procedure vital signs reviewed and stable Respiratory status: spontaneous breathing, nonlabored ventilation, respiratory function stable and patient connected to nasal cannula oxygen Cardiovascular status: blood pressure returned to baseline and stable Postop Assessment: no apparent nausea or vomiting Anesthetic complications: no   No notable events documented.   Last Vitals:  Vitals:   02/16/21 1715 02/16/21 1740  BP: 115/74 119/79  Pulse: (!) 56 61  Resp: 12 18  Temp: (!) 36.1 C 36.4 C  SpO2: 100% 100%    Last Pain:  Vitals:   02/16/21 1740  TempSrc: Temporal  PainSc: [place]

## 2021-02-28 ENCOUNTER — Other Ambulatory Visit (HOSPITAL_COMMUNITY): Payer: Self-pay

## 2021-03-02 ENCOUNTER — Other Ambulatory Visit (HOSPITAL_COMMUNITY): Payer: Self-pay

## 2021-03-02 MED ORDER — ESZOPICLONE 3 MG PO TABS
ORAL_TABLET | ORAL | 0 refills | Status: DC
  Filled 2021-03-02: qty 90, 90d supply, fill #0

## 2021-03-04 ENCOUNTER — Other Ambulatory Visit: Payer: Self-pay | Admitting: Family Medicine

## 2021-03-04 DIAGNOSIS — G8929 Other chronic pain: Secondary | ICD-10-CM

## 2021-03-04 DIAGNOSIS — R202 Paresthesia of skin: Secondary | ICD-10-CM

## 2021-03-04 DIAGNOSIS — M546 Pain in thoracic spine: Secondary | ICD-10-CM

## 2021-03-16 ENCOUNTER — Other Ambulatory Visit (HOSPITAL_COMMUNITY): Payer: Self-pay

## 2021-03-17 ENCOUNTER — Other Ambulatory Visit (HOSPITAL_COMMUNITY): Payer: Self-pay

## 2021-03-18 ENCOUNTER — Other Ambulatory Visit: Payer: Self-pay

## 2021-03-18 ENCOUNTER — Ambulatory Visit
Admission: RE | Admit: 2021-03-18 | Discharge: 2021-03-18 | Disposition: A | Discharge status: Home / Self Care | Payer: 59 | Source: Ambulatory Visit | Attending: Family Medicine | Admitting: Family Medicine

## 2021-03-18 DIAGNOSIS — M545 Low back pain, unspecified: Secondary | ICD-10-CM | POA: Diagnosis not present

## 2021-03-18 DIAGNOSIS — M546 Pain in thoracic spine: Secondary | ICD-10-CM | POA: Insufficient documentation

## 2021-03-18 DIAGNOSIS — M5442 Lumbago with sciatica, left side: Secondary | ICD-10-CM | POA: Diagnosis not present

## 2021-03-18 DIAGNOSIS — R202 Paresthesia of skin: Secondary | ICD-10-CM | POA: Insufficient documentation

## 2021-03-18 DIAGNOSIS — G8929 Other chronic pain: Secondary | ICD-10-CM | POA: Diagnosis not present

## 2021-03-18 DIAGNOSIS — M47814 Spondylosis without myelopathy or radiculopathy, thoracic region: Secondary | ICD-10-CM | POA: Diagnosis not present

## 2021-03-18 DIAGNOSIS — M47816 Spondylosis without myelopathy or radiculopathy, lumbar region: Secondary | ICD-10-CM | POA: Diagnosis not present

## 2021-03-18 DIAGNOSIS — R200 Anesthesia of skin: Secondary | ICD-10-CM | POA: Diagnosis not present

## 2021-03-18 IMAGING — MR MR THORACIC SPINE W/O CM
5 of 6 series · 31 of 48 positions shown · non-contrast
Comparison: Same day MRI of the lumbar spine [DATE].

CLINICAL DATA: Provided history: Paresthesia of foot, bilateral.
Chronic bilateral low back pain with left-sided sciatica. Acute
midline thoracic back pain. Additional history provided by scanning
technologist: Patient reports thoracic back pain with bilateral leg
numbness, numbness in right foot, symptoms for 6 months. Patient
also reports low back pain and pain in left flank.

EXAM:
MRI THORACIC SPINE WITHOUT CONTRAST
TECHNIQUE: Multiplanar, multisequence MR imaging of the thoracic spine was
performed. No intravenous contrast was administered.

[Series 38: T1 · sagittal · 6.0mm · 1.41mm/px · 3 of 9 slices shown (1 of 2)]
[im 1/9]
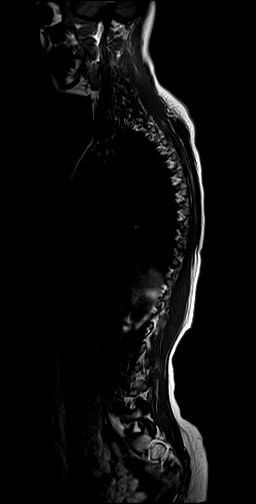
[im 5/9]
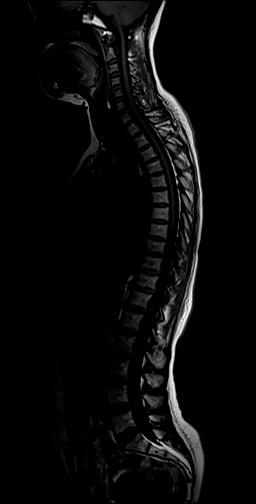
[im 9/9]
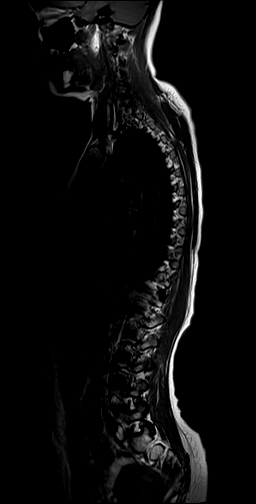

[Series 39: T2 · sagittal · 3.0mm · 1.06mm/px · 7 of 23 slices shown (1 of 2)]
[im 1/23]
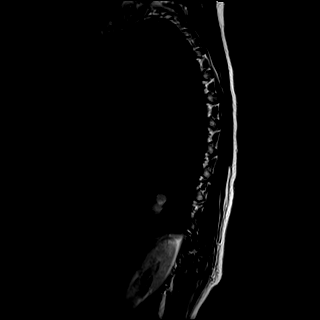
[im 4/23]
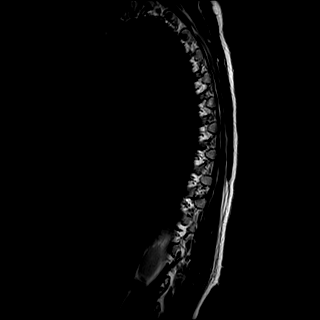
[im 8/23]
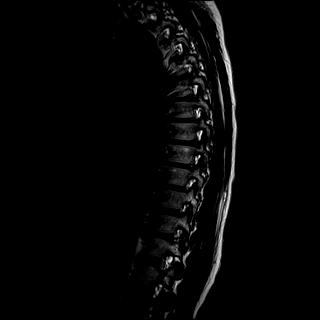
[im 12/23]
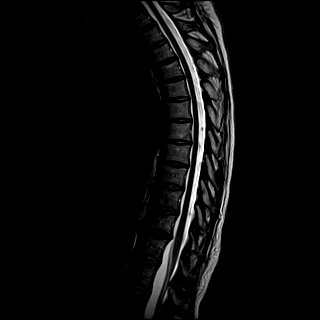
[im 15/23]
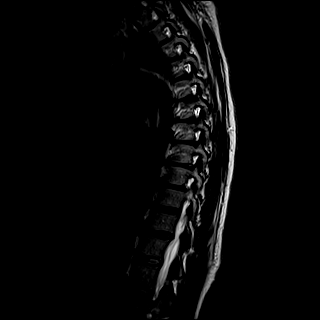
[im 19/23]
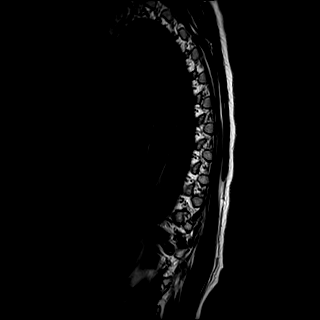
[im 23/23]
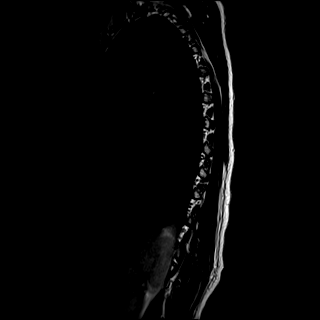

[Series 40: T1 · sagittal · 3.0mm · 1.06mm/px · 7 of 23 slices shown (2 of 2)]
[im 1/23]
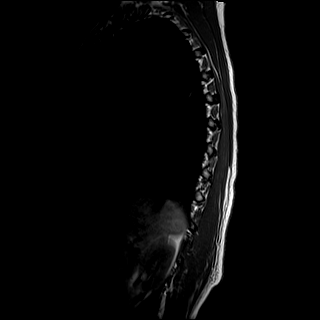
[im 4/23]
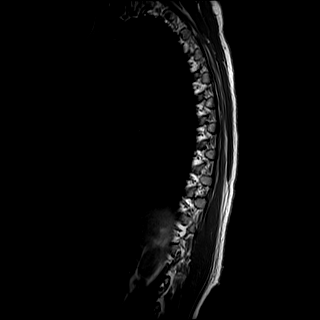
[im 8/23]
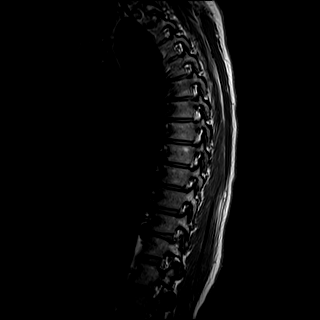
[im 12/23]
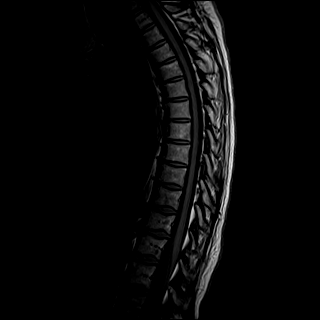
[im 15/23]
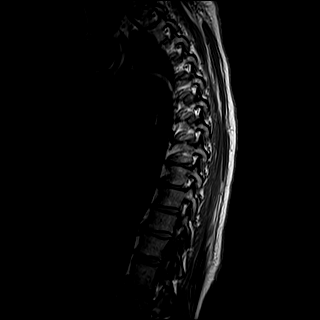
[im 19/23]
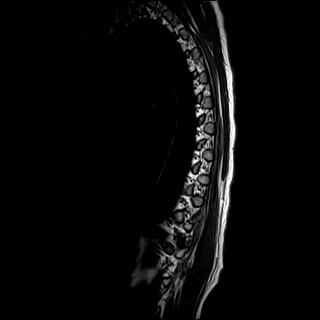
[im 23/23]
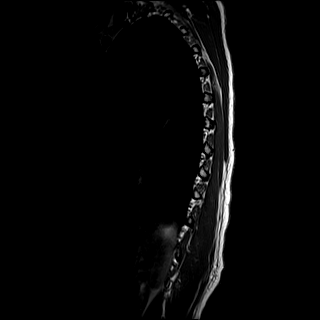

[Series 41: STIR · sagittal · 3.0mm · 0.53mm/px · 5 of 23 slices shown]
[im 1/23]
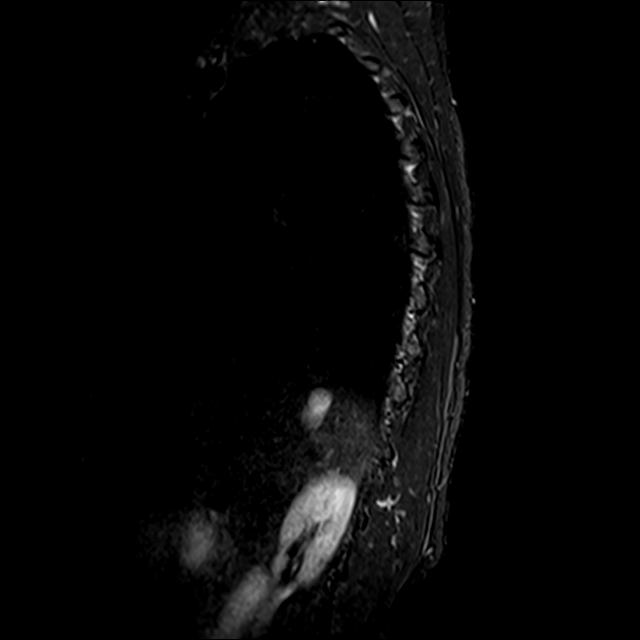
[im 4/23]
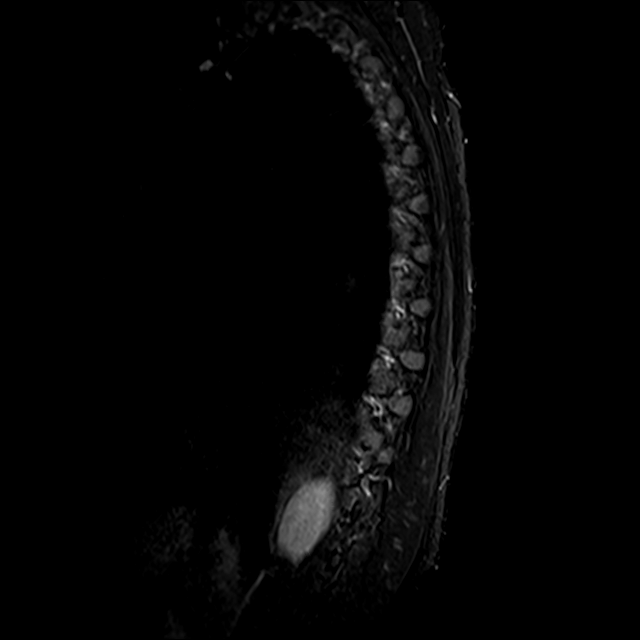
[im 8/23]
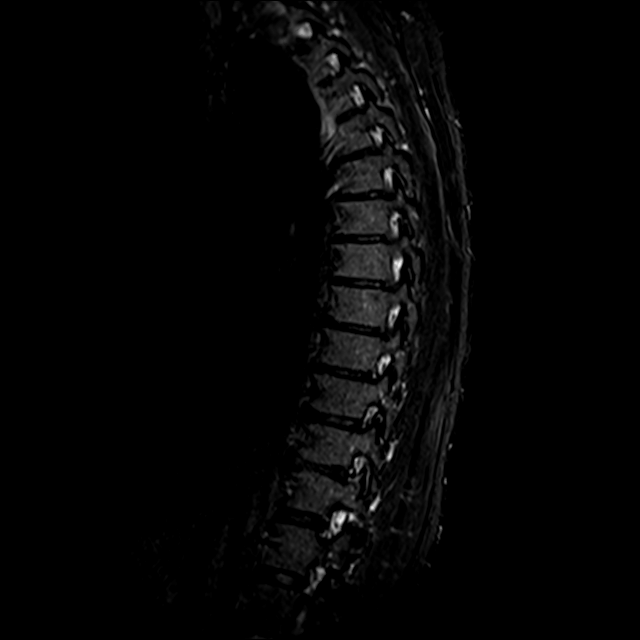
[im 12/23]
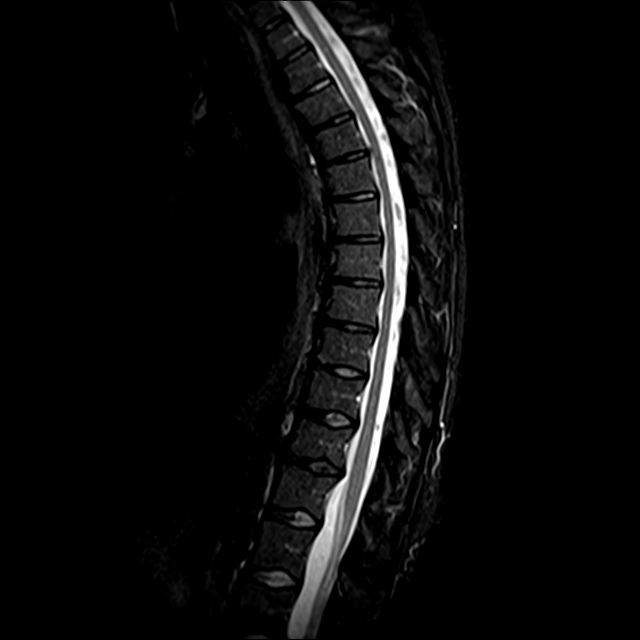
[im 15/23]
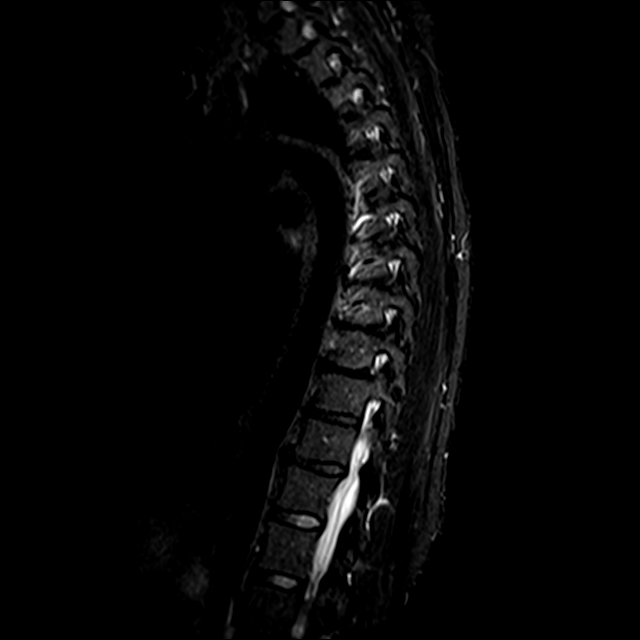

[Series 42: T2 · axial · 4.0mm · 0.59mm/px · z∈[-246,-47]mm · 9 of 39 slices shown (2 of 2)]
[im 1/39]
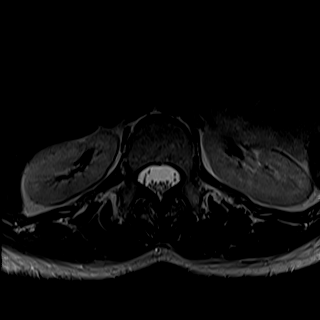
[im 7/39]
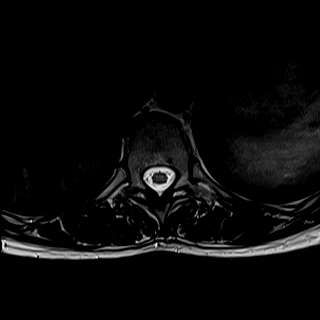
[im 11/39]
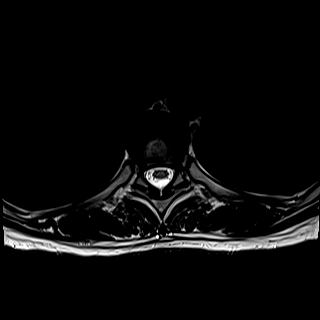
[im 18/39]
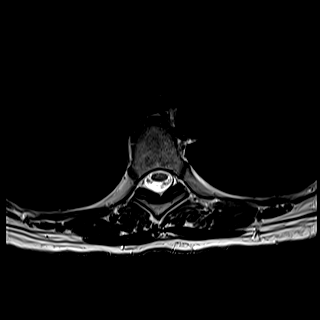
[im 21/39]
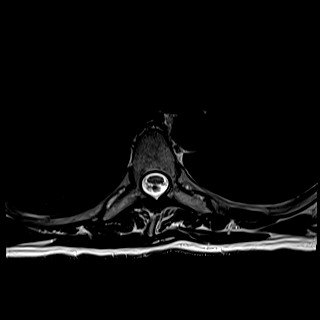
[im 28/39]
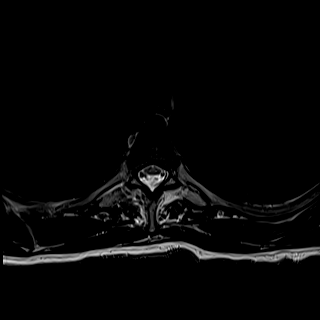
[im 32/39]
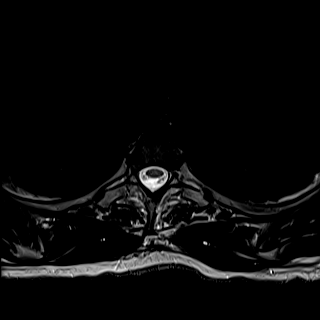
[im 35/39]
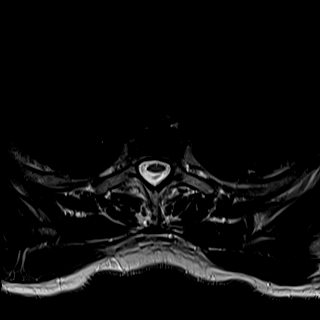
[im 39/39]
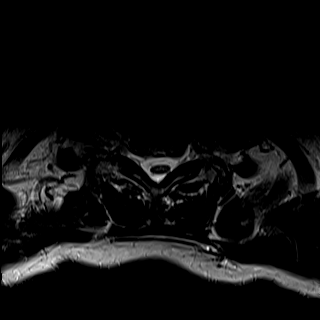

[31 of 48 positions shown; findings below may reference images not displayed]

FINDINGS: Alignment:  No significant spondylolisthesis.

Vertebrae: Vertebral body height is maintained. No significant
marrow edema or focal suspicious osseous lesion.

Cord: No signal abnormality identified within the spinal cord at the
thoracic levels.

Paraspinal and other soft tissues: 1.8 cm T2 hyperintense lesion
within the right hepatic lobe, incompletely assessed in the absence
of intravenous contrast but likely reflecting a cyst. Paraspinal
soft tissues unremarkable.

Disc levels:

No more than mild disc degeneration within the thoracic spine. There
are shallow multilevel disc bulges, which minimally efface the
ventral thecal sac. However, there is no significant spinal cord
mass effect or significant central canal stenosis. Multilevel facet
arthrosis and ligamentum flavum hypertrophy, greatest within the
lower thoracic spine. No compressive foraminal stenosis.
IMPRESSION: Thoracic spondylosis, as described. Shallow disc bulges minimally
efface the ventral thecal sac at several levels. However, there is
no significant spinal cord mass effect or central canal stenosis. No
compressive foraminal stenosis.

## 2021-03-18 IMAGING — MR MR LUMBAR SPINE W/O CM
5 series · 30 of 48 positions shown · non-contrast
Comparison: Same day thoracic spine MRI [DATE]. Lumbar spine
MRI [DATE].

CLINICAL DATA: Provided history: Paresthesia of foot, bilateral.
Chronic bilateral low back pain with left-sided sciatica. Acute
midline thoracic back pain. Additional history provided by scanning
technologist: Patient reports thoracic back pain with bilateral leg
numbness, numbness in right foot, symptoms for 6 months. Patient
also reports low back pain with pain and left flank.

EXAM:
MRI LUMBAR SPINE WITHOUT CONTRAST
TECHNIQUE: Multiplanar, multisequence MR imaging of the lumbar spine was
performed. No intravenous contrast was administered.

[Series 1: T2 · sagittal · 4.0mm · 0.88mm/px · 6 of 17 slices shown (1 of 2)]
[im 1/17]
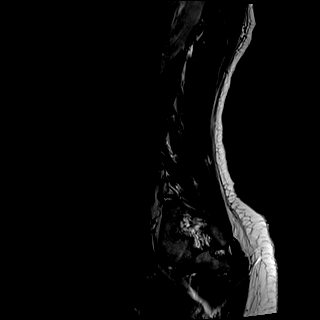
[im 4/17]
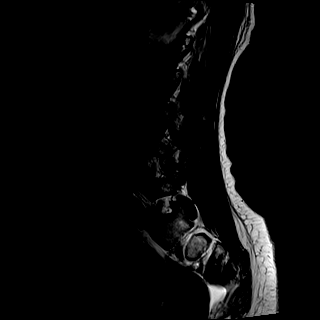
[im 7/17]
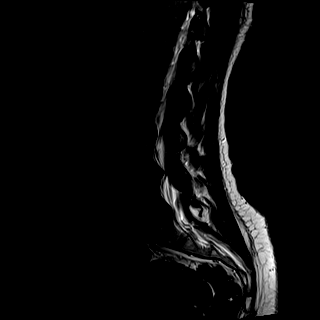
[im 10/17]
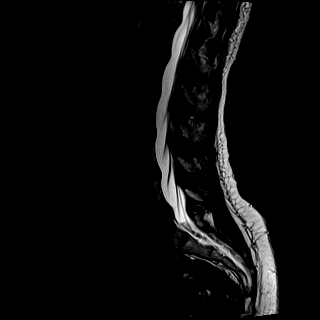
[im 13/17]
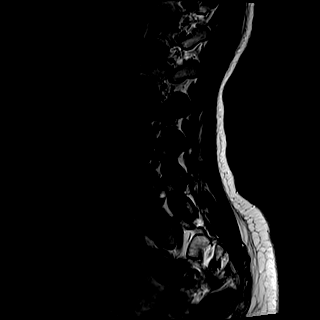
[im 17/17]
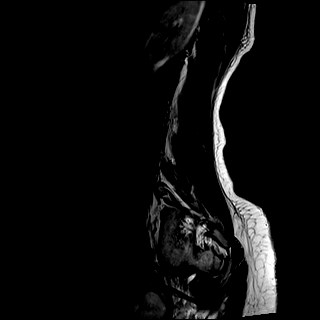

[Series 2: T1 · sagittal · 4.0mm · 0.88mm/px · 7 of 17 slices shown (1 of 2)]
[im 1/17]
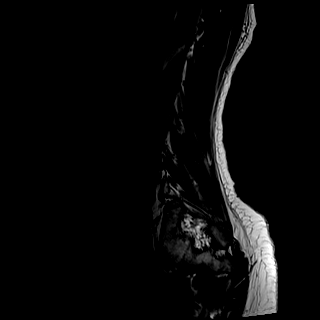
[im 3/17]
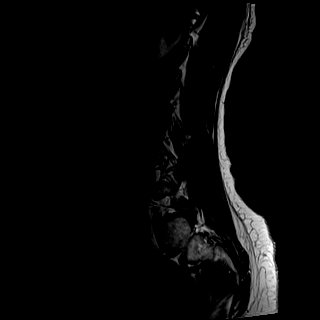
[im 6/17]
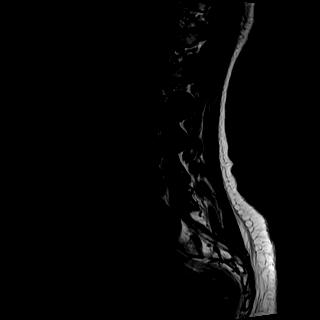
[im 9/17]
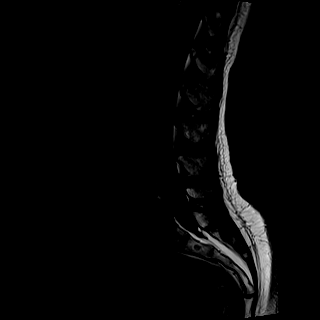
[im 11/17]
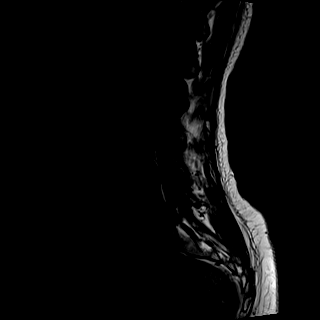
[im 14/17]
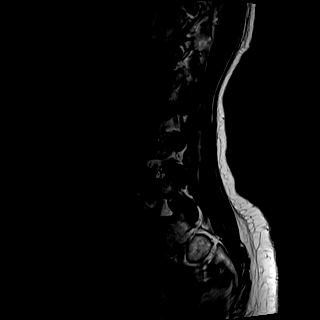
[im 17/17]
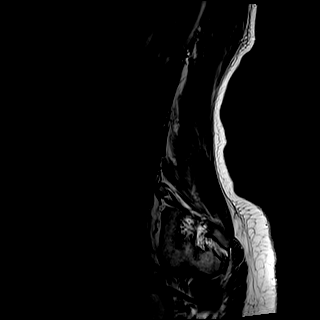

[Series 3: STIR · sagittal · 4.0mm · 0.44mm/px · 1 of 17 slices shown]
[im 1/17]
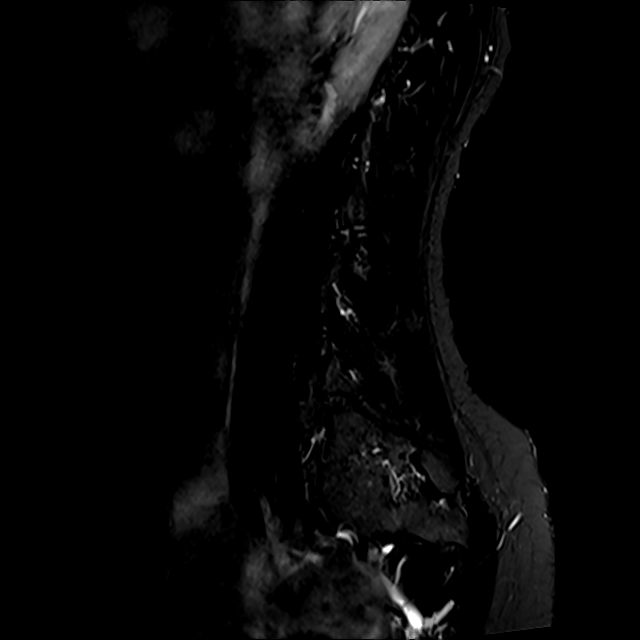

[Series 4: T2 · axial · 4.0mm · 0.78mm/px · z∈[-470,-246]mm · 8 of 36 slices shown (2 of 2)]
[im 1/36]
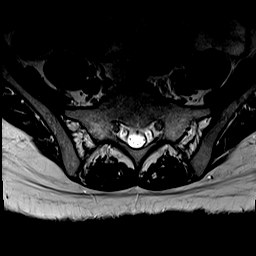
[im 6/36]
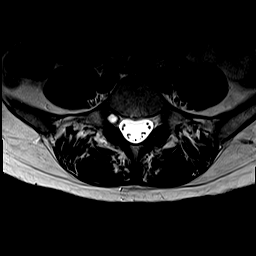
[im 11/36]
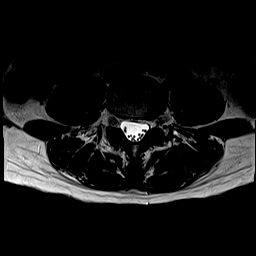
[im 17/36]
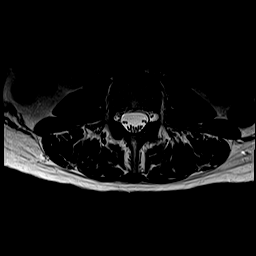
[im 19/36]
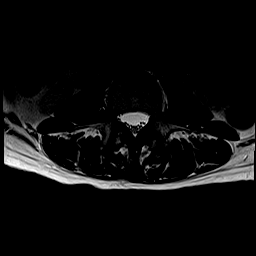
[im 25/36]
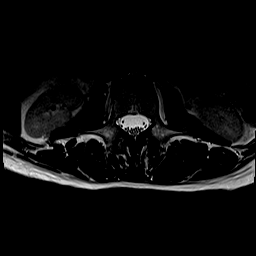
[im 30/36]
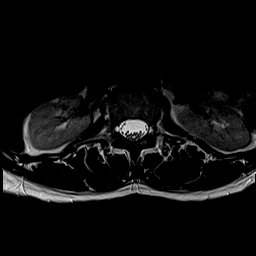
[im 36/36]
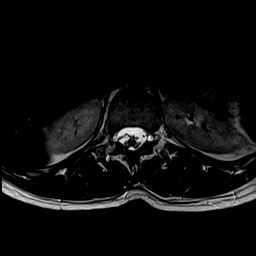

[Series 5: T1 · axial · 4.0mm · 0.39mm/px · z∈[-470,-246]mm · 8 of 36 slices shown (2 of 2)]
[im 1/36]
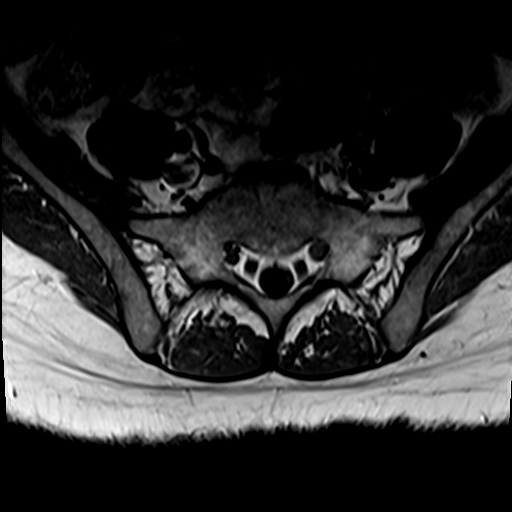
[im 6/36]
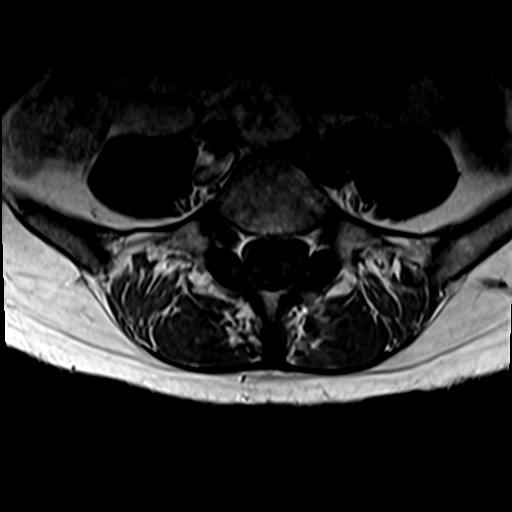
[im 11/36]
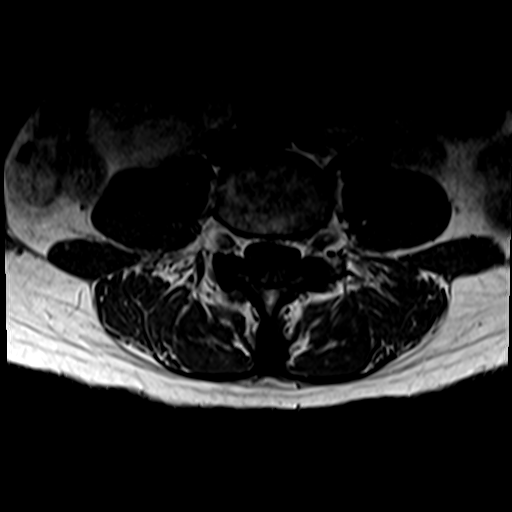
[im 17/36]
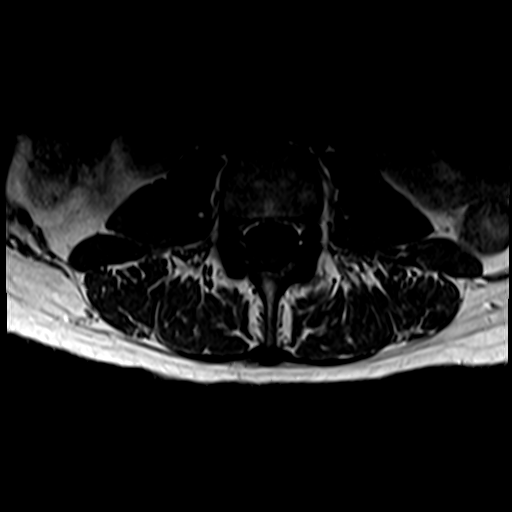
[im 19/36]
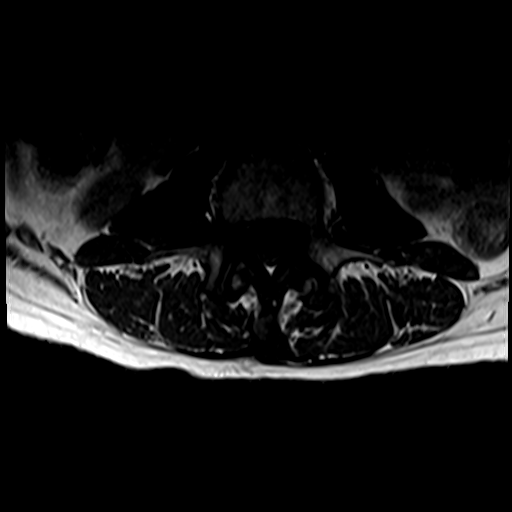
[im 25/36]
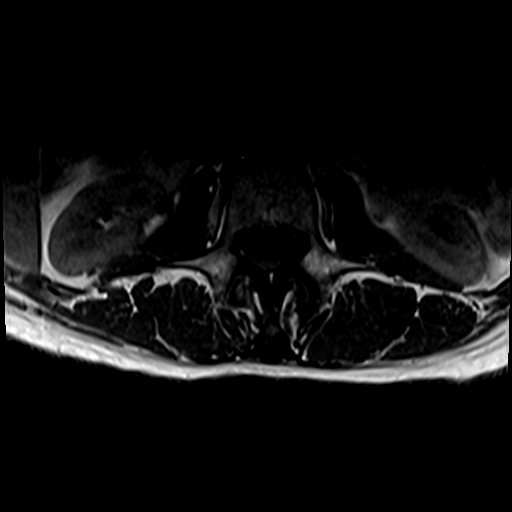
[im 30/36]
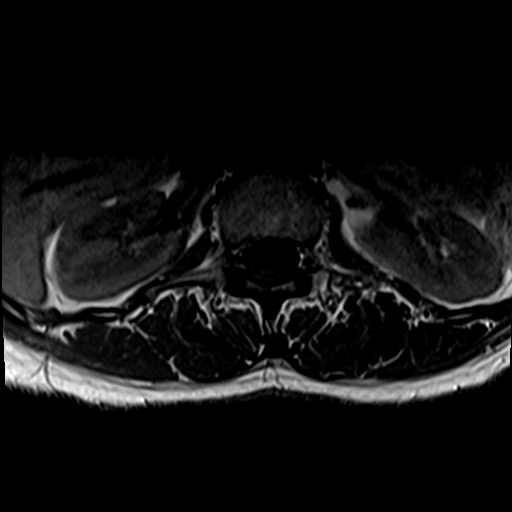
[im 36/36]
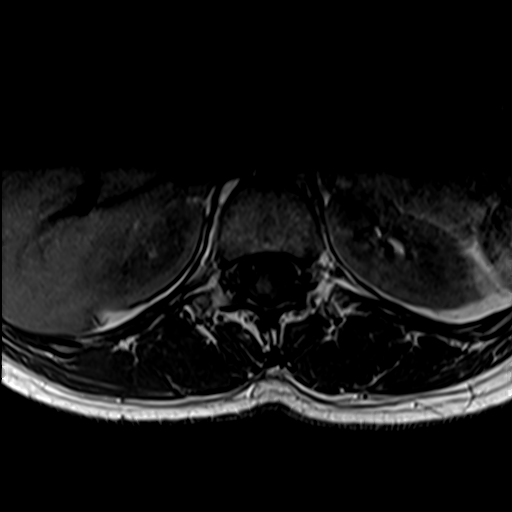

[30 of 48 positions shown; findings below may reference images not displayed]

FINDINGS: Segmentation: 5 lumbar vertebrae. The caudal most well-formed
intervertebral disc space is designated L5-S1.

Alignment:  No significant spondylolisthesis.

Vertebrae: Vertebral body height is maintained. No significant
marrow edema or focal suspicious osseous lesion.

Conus medullaris and cauda equina: Conus extends to the T12-L1
level. No signal abnormality within the visualized distal spinal
cord.

Paraspinal and other soft tissues: Probable cyst within the right
hepatic lobe better appreciated on concurrently performed MRI of the
thoracic spine. Paraspinal soft tissues unremarkable.

Disc levels:

Unless otherwise stated, the level by level findings below have not
significantly changed from the prior MRI of [DATE].

No other mild degeneration within the lumbar or visualized lower
thoracic spine. Degeneration is greatest at L4-L5.

T12-L1: Minimal facet arthrosis. No significant disc herniation or
stenosis.

L1-L2: Facet arthrosis and ligamentum flavum hypertrophy. No
significant disc herniation or stenosis.

L2-L3: Slight disc bulge. Facet arthrosis and ligamentum flavum
hypertrophy. No significant spinal canal or foraminal stenosis.

L3-L4: Small disc bulge. Facet arthrosis/ligamentum flavum
hypertrophy. No significant spinal canal or foraminal stenosis.

L4-L5: Disc bulge. A superimposed broad-based left
subarticular/foraminal disc protrusion has slightly progressed
(series 4, image 27). Facet arthrosis/ligamentum flavum hypertrophy.
As before, the disc protrusion contributes to left subarticular
stenosis, encroaching upon the descending left L5 nerve root (series
4, image 28). No significant foraminal stenosis.

L5-S1: Mild facet arthrosis. No significant disc herniation or
stenosis.
IMPRESSION: Comparison is made to the prior lumbar spine MRI of [DATE].

Lumbar spondylosis, as outlined with findings most notably as
follows.

At L4-L5, there is mild disc degeneration. Disc bulge. A
superimposed broad-based left subarticular/foraminal disc protrusion
has slightly progressed. Facet arthrosis/ligamentum flavum
hypertrophy. As before, the disc protrusion contributes to left
subarticular stenosis, encroaching upon the descending left L5 nerve
root. Correlate for left L5 radiculopathy. No significant central
canal or foraminal stenosis.

Lumbar spondylosis has otherwise not significantly changed. No
significant spinal canal or foraminal stenosis at the remaining
levels.

## 2021-03-20 DIAGNOSIS — M654 Radial styloid tenosynovitis [de Quervain]: Secondary | ICD-10-CM | POA: Diagnosis not present

## 2021-03-24 ENCOUNTER — Other Ambulatory Visit (HOSPITAL_COMMUNITY): Payer: Self-pay

## 2021-03-24 MED ORDER — CYCLOBENZAPRINE HCL 10 MG PO TABS
10.0000 mg | ORAL_TABLET | Freq: Three times a day (TID) | ORAL | 3 refills | Status: DC | PRN
  Filled 2021-03-24: qty 90, 30d supply, fill #0
  Filled 2021-11-13: qty 90, 30d supply, fill #1
  Filled 2021-12-29: qty 90, 30d supply, fill #2
  Filled 2022-01-21: qty 90, 30d supply, fill #3

## 2021-03-31 ENCOUNTER — Encounter: Payer: Self-pay | Admitting: Obstetrics and Gynecology

## 2021-04-13 ENCOUNTER — Other Ambulatory Visit (HOSPITAL_COMMUNITY): Payer: Self-pay

## 2021-05-04 ENCOUNTER — Other Ambulatory Visit (HOSPITAL_COMMUNITY): Payer: Self-pay

## 2021-05-05 ENCOUNTER — Other Ambulatory Visit (HOSPITAL_COMMUNITY): Payer: Self-pay

## 2021-05-05 MED ORDER — EMGALITY 120 MG/ML ~~LOC~~ SOAJ
SUBCUTANEOUS | 11 refills | Status: DC
  Filled 2021-05-05: qty 1, 30d supply, fill #0
  Filled 2021-06-01: qty 1, 30d supply, fill #1
  Filled 2021-07-10: qty 1, 30d supply, fill #2
  Filled 2021-08-19: qty 1, 30d supply, fill #3
  Filled 2021-09-28: qty 1, 30d supply, fill #4

## 2021-05-06 ENCOUNTER — Other Ambulatory Visit (HOSPITAL_COMMUNITY): Payer: Self-pay

## 2021-05-21 ENCOUNTER — Other Ambulatory Visit (HOSPITAL_COMMUNITY): Payer: Self-pay

## 2021-05-22 ENCOUNTER — Other Ambulatory Visit (HOSPITAL_COMMUNITY): Payer: Self-pay

## 2021-05-25 ENCOUNTER — Other Ambulatory Visit: Payer: Self-pay | Admitting: Obstetrics and Gynecology

## 2021-05-25 ENCOUNTER — Other Ambulatory Visit (HOSPITAL_COMMUNITY): Payer: Self-pay

## 2021-05-25 DIAGNOSIS — Z1231 Encounter for screening mammogram for malignant neoplasm of breast: Secondary | ICD-10-CM

## 2021-05-25 MED ORDER — ESZOPICLONE 3 MG PO TABS
ORAL_TABLET | ORAL | 0 refills | Status: DC
  Filled 2021-05-25 – 2021-05-28 (×2): qty 90, 90d supply, fill #0

## 2021-05-28 ENCOUNTER — Other Ambulatory Visit (HOSPITAL_COMMUNITY): Payer: Self-pay

## 2021-06-01 ENCOUNTER — Other Ambulatory Visit (HOSPITAL_COMMUNITY): Payer: Self-pay

## 2021-06-02 DIAGNOSIS — Z1231 Encounter for screening mammogram for malignant neoplasm of breast: Secondary | ICD-10-CM | POA: Diagnosis not present

## 2021-06-02 DIAGNOSIS — Z01419 Encounter for gynecological examination (general) (routine) without abnormal findings: Secondary | ICD-10-CM | POA: Diagnosis not present

## 2021-06-05 ENCOUNTER — Other Ambulatory Visit (HOSPITAL_COMMUNITY): Payer: Self-pay

## 2021-06-05 ENCOUNTER — Other Ambulatory Visit: Payer: Self-pay

## 2021-07-08 ENCOUNTER — Other Ambulatory Visit (HOSPITAL_COMMUNITY): Payer: Self-pay

## 2021-07-10 ENCOUNTER — Other Ambulatory Visit (HOSPITAL_COMMUNITY): Payer: Self-pay

## 2021-07-14 ENCOUNTER — Other Ambulatory Visit (HOSPITAL_COMMUNITY): Payer: Self-pay

## 2021-07-22 ENCOUNTER — Other Ambulatory Visit (HOSPITAL_COMMUNITY): Payer: Self-pay

## 2021-07-30 ENCOUNTER — Other Ambulatory Visit (HOSPITAL_COMMUNITY): Payer: Self-pay

## 2021-07-30 DIAGNOSIS — K0889 Other specified disorders of teeth and supporting structures: Secondary | ICD-10-CM | POA: Diagnosis not present

## 2021-07-30 DIAGNOSIS — Z1331 Encounter for screening for depression: Secondary | ICD-10-CM | POA: Diagnosis not present

## 2021-07-30 DIAGNOSIS — M5442 Lumbago with sciatica, left side: Secondary | ICD-10-CM | POA: Diagnosis not present

## 2021-07-30 DIAGNOSIS — Z131 Encounter for screening for diabetes mellitus: Secondary | ICD-10-CM | POA: Diagnosis not present

## 2021-07-30 DIAGNOSIS — Z0000 Encounter for general adult medical examination without abnormal findings: Secondary | ICD-10-CM | POA: Diagnosis not present

## 2021-07-30 DIAGNOSIS — G4726 Circadian rhythm sleep disorder, shift work type: Secondary | ICD-10-CM | POA: Diagnosis not present

## 2021-07-30 DIAGNOSIS — Z1329 Encounter for screening for other suspected endocrine disorder: Secondary | ICD-10-CM | POA: Diagnosis not present

## 2021-07-30 DIAGNOSIS — E221 Hyperprolactinemia: Secondary | ICD-10-CM | POA: Diagnosis not present

## 2021-07-30 DIAGNOSIS — Z136 Encounter for screening for cardiovascular disorders: Secondary | ICD-10-CM | POA: Diagnosis not present

## 2021-07-30 MED ORDER — AMOXICILLIN 875 MG PO TABS
ORAL_TABLET | ORAL | 0 refills | Status: DC
  Filled 2021-07-30: qty 20, 10d supply, fill #0

## 2021-07-30 MED ORDER — FLUCONAZOLE 150 MG PO TABS
ORAL_TABLET | ORAL | 0 refills | Status: DC
  Filled 2021-07-30: qty 2, 7d supply, fill #0

## 2021-07-30 MED ORDER — GABAPENTIN 100 MG PO CAPS
ORAL_CAPSULE | ORAL | 0 refills | Status: DC
  Filled 2021-07-30: qty 180, 90d supply, fill #0

## 2021-07-30 MED ORDER — BELSOMRA 10 MG PO TABS
ORAL_TABLET | ORAL | 2 refills | Status: DC
  Filled 2021-07-30: qty 30, 30d supply, fill #0

## 2021-07-31 ENCOUNTER — Other Ambulatory Visit (HOSPITAL_COMMUNITY): Payer: Self-pay

## 2021-08-13 ENCOUNTER — Other Ambulatory Visit (HOSPITAL_COMMUNITY): Payer: Self-pay

## 2021-08-13 MED ORDER — HYDROCORTISONE ACETATE 25 MG RE SUPP
RECTAL | 11 refills | Status: DC
  Filled 2021-08-13: qty 20, 10d supply, fill #0
  Filled 2022-03-25: qty 20, 10d supply, fill #1

## 2021-08-13 MED ORDER — ESZOPICLONE 3 MG PO TABS
ORAL_TABLET | ORAL | 1 refills | Status: DC
  Filled 2021-08-13 – 2021-08-27 (×2): qty 90, 90d supply, fill #0
  Filled 2021-11-13 – 2021-11-23 (×2): qty 90, 90d supply, fill #1

## 2021-08-19 ENCOUNTER — Other Ambulatory Visit (HOSPITAL_COMMUNITY): Payer: Self-pay

## 2021-08-20 ENCOUNTER — Ambulatory Visit
Admission: RE | Admit: 2021-08-20 | Discharge: 2021-08-20 | Disposition: A | Discharge status: Home / Self Care | Payer: 59 | Source: Ambulatory Visit | Attending: Obstetrics and Gynecology | Admitting: Obstetrics and Gynecology

## 2021-08-20 DIAGNOSIS — Z1231 Encounter for screening mammogram for malignant neoplasm of breast: Secondary | ICD-10-CM | POA: Insufficient documentation

## 2021-08-20 IMAGING — MG MM DIGITAL SCREENING BILAT W/ TOMO AND CAD
8 series · 9 of 24 positions shown · non-contrast
Comparison: Previous exam(s).

CLINICAL DATA: Screening.

EXAM:
DIGITAL SCREENING BILATERAL MAMMOGRAM WITH TOMOSYNTHESIS AND CAD
TECHNIQUE: Bilateral screening digital craniocaudal and mediolateral oblique
mammograms were obtained. Bilateral screening digital breast
tomosynthesis was performed. The images were evaluated with
computer-aided detection.

[L CC synth-2D]
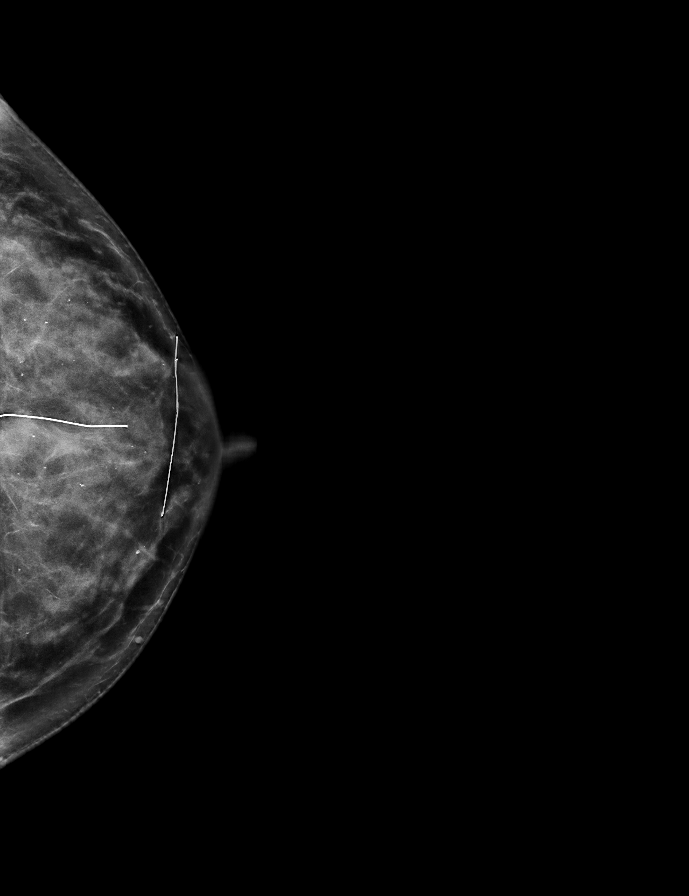

[L MLO synth-2D]
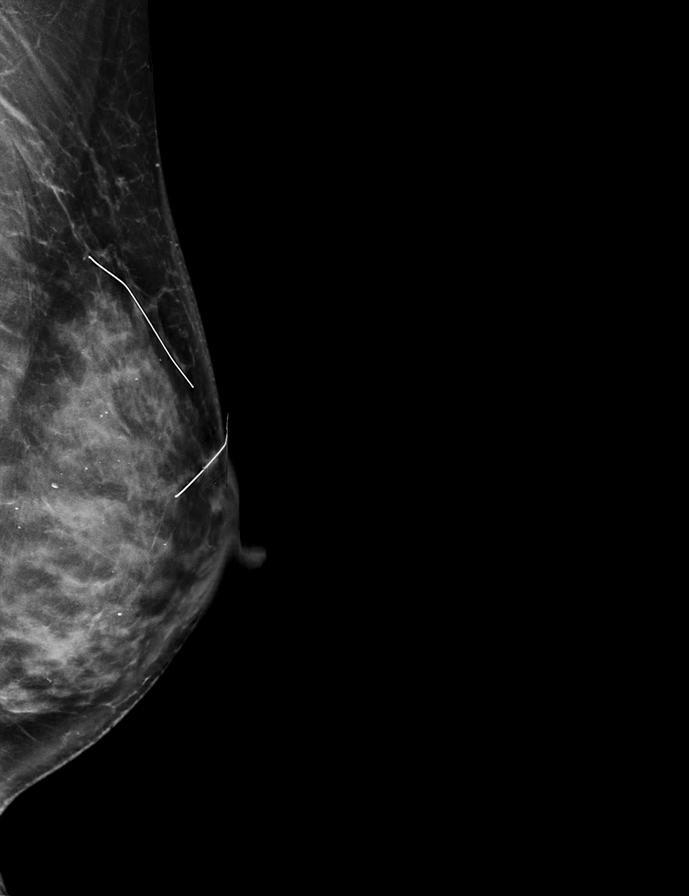

[R CC synth-2D]
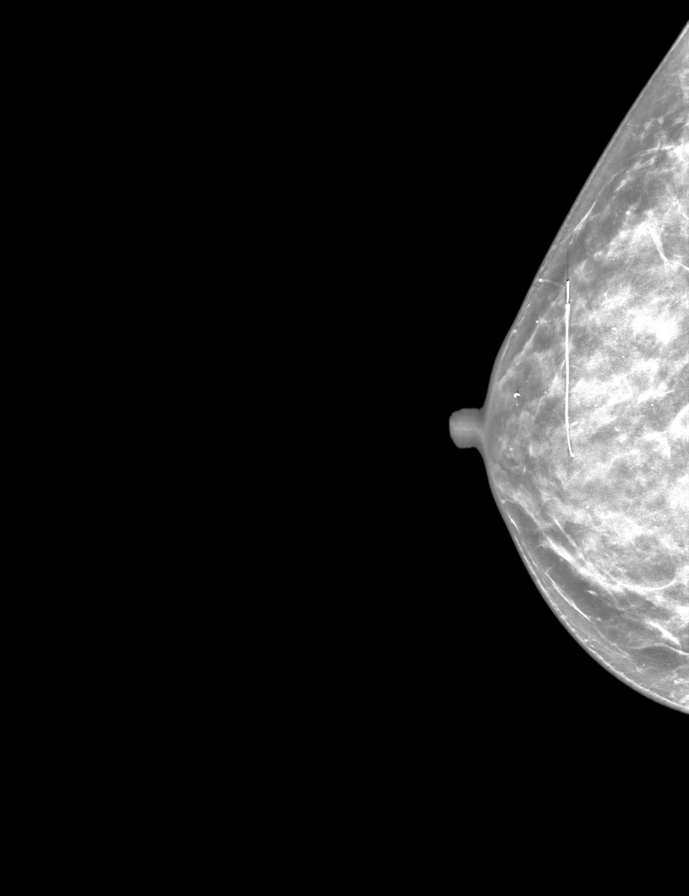

[R MLO synth-2D]
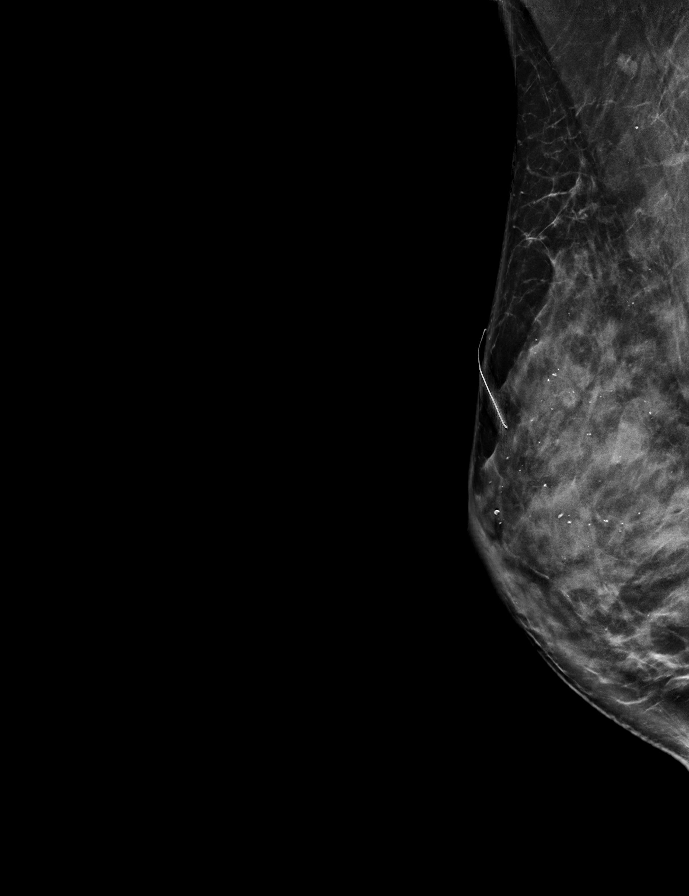

[L MLO tomo · 2 of 75 frames shown]
[frame 25/75]
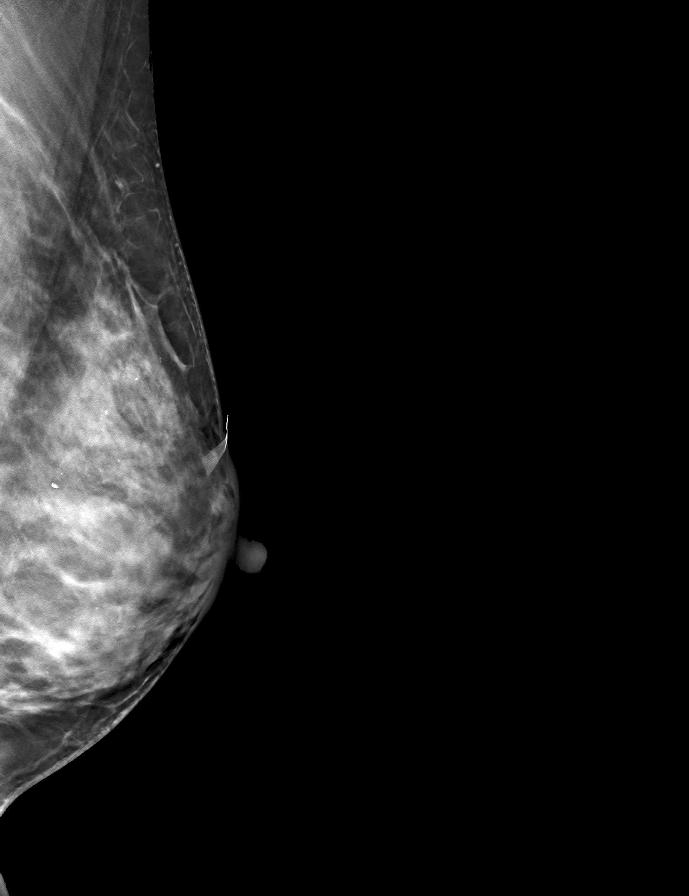
[frame 38/75]
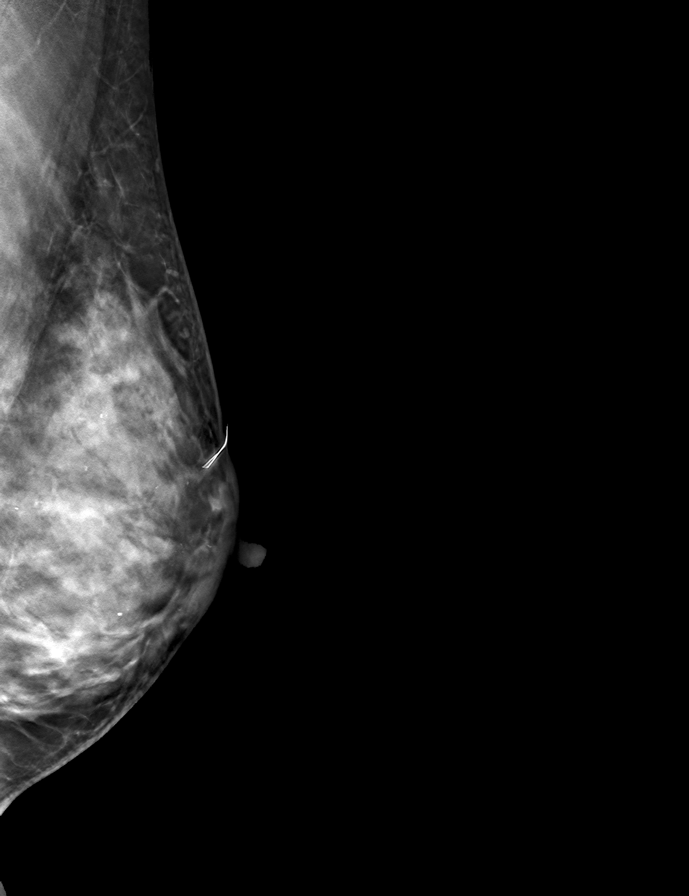

[R MLO tomo · tomo slice 39/77.0]
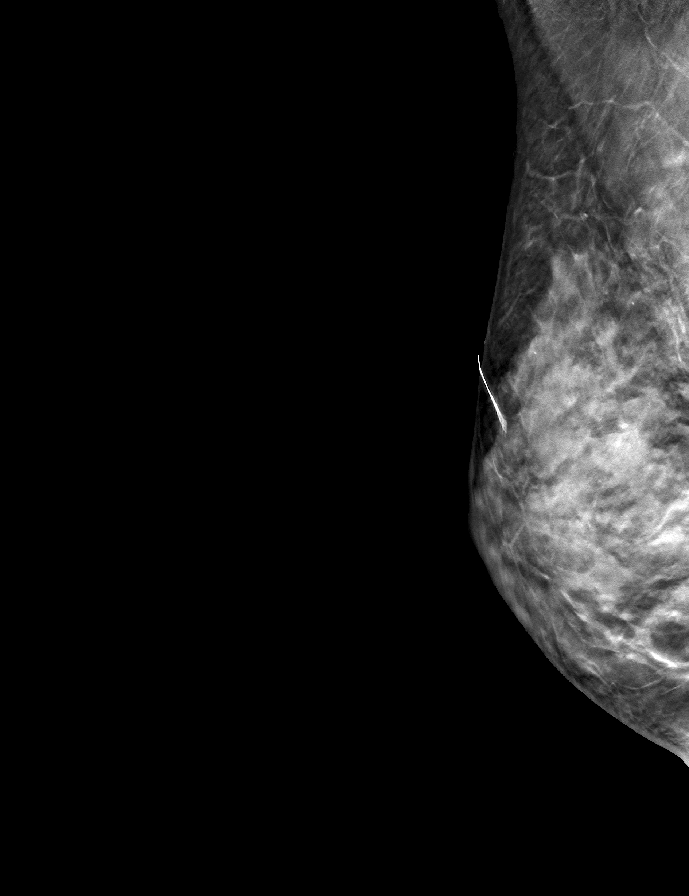

[L CC tomo · tomo slice 37/74.0]
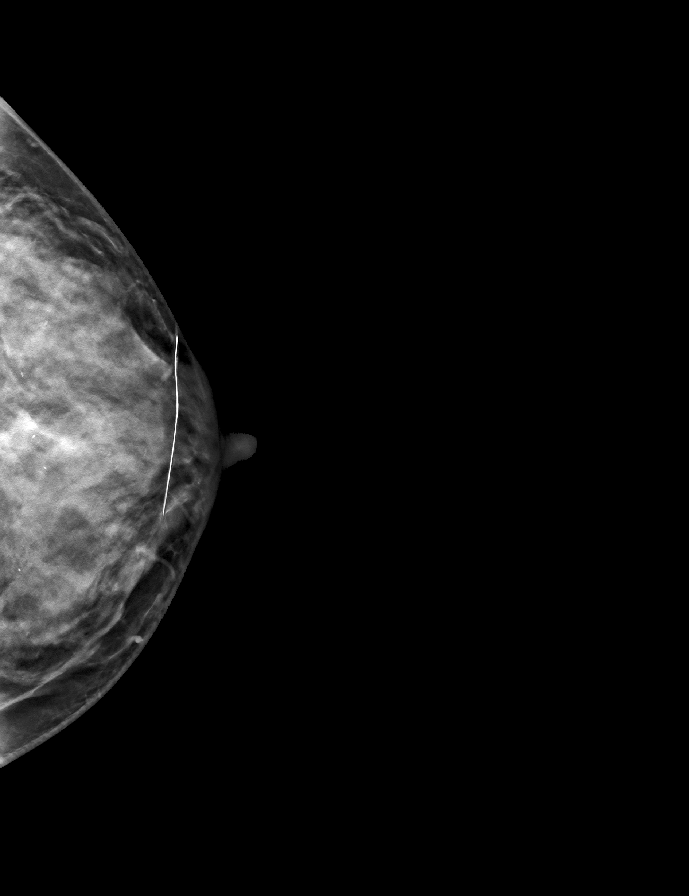

[R CC tomo · tomo slice 31/60.0]
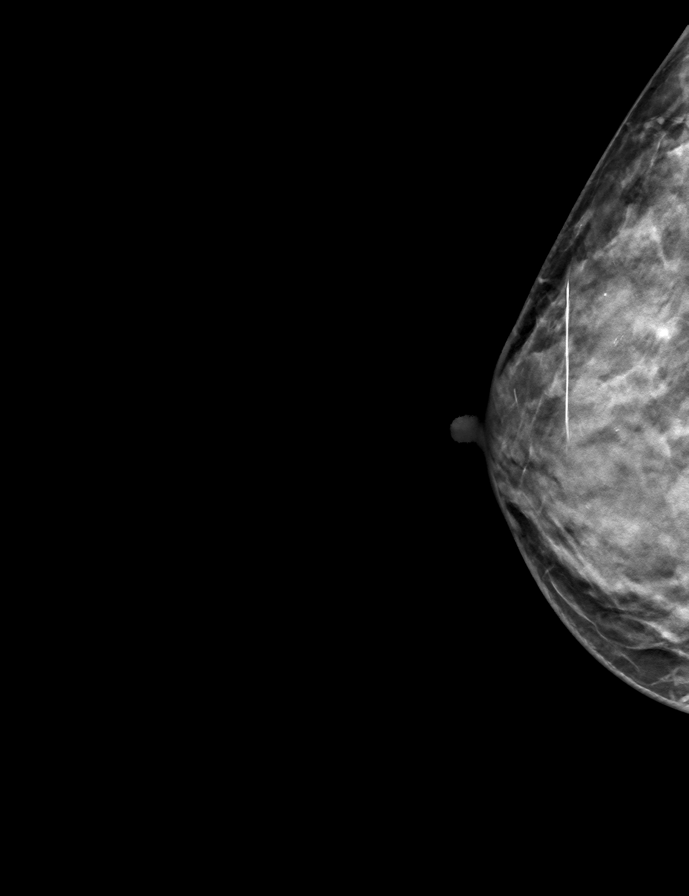

[9 of 24 positions shown; findings below may reference images not displayed]

ACR Breast Density Category c: The breast tissue is heterogeneously
dense, which may obscure small masses.
FINDINGS: There are no findings suspicious for malignancy.
IMPRESSION: No mammographic evidence of malignancy. A result letter of this
screening mammogram will be mailed directly to the patient.

RECOMMENDATION:
Screening mammogram in one year. (Code:[V2])

BI-RADS CATEGORY  1: Negative.

## 2021-08-27 ENCOUNTER — Other Ambulatory Visit (HOSPITAL_COMMUNITY): Payer: Self-pay

## 2021-08-31 ENCOUNTER — Ambulatory Visit (LOCAL_COMMUNITY_HEALTH_CENTER): Payer: 59

## 2021-08-31 DIAGNOSIS — Z23 Encounter for immunization: Secondary | ICD-10-CM

## 2021-08-31 NOTE — Progress Notes (Signed)
In Nurse Clinic requesting Hep A vaccine. Traveling to [place] this summer. Hep A administered and tolerated well. Updated [facility] copy given and explained. Jerel Shepherd, RN

## 2021-09-28 ENCOUNTER — Other Ambulatory Visit (HOSPITAL_COMMUNITY): Payer: Self-pay

## 2021-10-02 ENCOUNTER — Other Ambulatory Visit (HOSPITAL_COMMUNITY): Payer: Self-pay

## 2021-10-03 ENCOUNTER — Other Ambulatory Visit (HOSPITAL_COMMUNITY): Payer: Self-pay

## 2021-10-13 DIAGNOSIS — M79644 Pain in right finger(s): Secondary | ICD-10-CM | POA: Diagnosis not present

## 2021-10-16 ENCOUNTER — Other Ambulatory Visit (HOSPITAL_COMMUNITY): Payer: Self-pay

## 2021-10-16 MED ORDER — GABAPENTIN 100 MG PO CAPS
ORAL_CAPSULE | ORAL | 3 refills | Status: AC
  Filled 2021-10-16: qty 270, 90d supply, fill #0
  Filled 2022-01-21: qty 270, 90d supply, fill #1
  Filled 2022-04-12: qty 270, 90d supply, fill #2

## 2021-10-16 MED ORDER — ESCITALOPRAM OXALATE 10 MG PO TABS
10.0000 mg | ORAL_TABLET | Freq: Every day | ORAL | 3 refills | Status: AC
  Filled 2021-10-16: qty 90, 90d supply, fill #0
  Filled 2021-12-29: qty 90, 90d supply, fill #1
  Filled 2022-01-21 – 2022-04-13 (×2): qty 90, 90d supply, fill #2
  Filled 2022-09-01: qty 90, 90d supply, fill #3

## 2021-10-27 DIAGNOSIS — M79644 Pain in right finger(s): Secondary | ICD-10-CM | POA: Diagnosis not present

## 2021-11-13 ENCOUNTER — Other Ambulatory Visit (HOSPITAL_COMMUNITY): Payer: Self-pay

## 2021-11-19 ENCOUNTER — Other Ambulatory Visit (HOSPITAL_COMMUNITY): Payer: Self-pay

## 2021-11-23 ENCOUNTER — Other Ambulatory Visit (HOSPITAL_COMMUNITY): Payer: Self-pay

## 2021-12-29 ENCOUNTER — Other Ambulatory Visit (HOSPITAL_COMMUNITY): Payer: Self-pay

## 2021-12-30 ENCOUNTER — Other Ambulatory Visit (HOSPITAL_COMMUNITY): Payer: Self-pay

## 2022-01-21 ENCOUNTER — Other Ambulatory Visit (HOSPITAL_COMMUNITY): Payer: Self-pay

## 2022-01-22 ENCOUNTER — Other Ambulatory Visit (HOSPITAL_COMMUNITY): Payer: Self-pay

## 2022-01-22 MED ORDER — ESZOPICLONE 3 MG PO TABS
3.0000 mg | ORAL_TABLET | Freq: Every evening | ORAL | 1 refills | Status: DC | PRN
  Filled 2022-01-22 – 2022-02-23 (×2): qty 90, 90d supply, fill #0
  Filled 2022-05-25: qty 90, 90d supply, fill #1
  Filled 2022-06-03: qty 30, 30d supply, fill #0
  Filled 2022-06-04 (×2): qty 70, 70d supply, fill #0
  Filled 2022-06-04 (×2): qty 20, 20d supply, fill #0

## 2022-01-22 MED ORDER — TOPIRAMATE 25 MG PO TABS
75.0000 mg | ORAL_TABLET | Freq: Every day | ORAL | 3 refills | Status: DC
  Filled 2022-01-22 – 2022-03-25 (×2): qty 270, 90d supply, fill #0

## 2022-02-15 ENCOUNTER — Other Ambulatory Visit (HOSPITAL_COMMUNITY): Payer: Self-pay

## 2022-02-24 ENCOUNTER — Other Ambulatory Visit (HOSPITAL_COMMUNITY): Payer: Self-pay

## 2022-03-25 ENCOUNTER — Other Ambulatory Visit: Payer: Self-pay

## 2022-03-25 ENCOUNTER — Other Ambulatory Visit (HOSPITAL_COMMUNITY): Payer: Self-pay

## 2022-03-26 ENCOUNTER — Other Ambulatory Visit (HOSPITAL_COMMUNITY): Payer: Self-pay

## 2022-03-26 MED ORDER — SUMATRIPTAN SUCCINATE 100 MG PO TABS
100.0000 mg | ORAL_TABLET | Freq: Every day | ORAL | 3 refills | Status: AC | PRN
  Filled 2022-03-26: qty 27, 90d supply, fill #0
  Filled 2022-06-06 – 2022-09-01 (×2): qty 27, 90d supply, fill #1

## 2022-03-26 MED ORDER — CYCLOBENZAPRINE HCL 10 MG PO TABS
10.0000 mg | ORAL_TABLET | Freq: Three times a day (TID) | ORAL | 11 refills | Status: DC | PRN
  Filled 2022-03-26: qty 90, 30d supply, fill #0

## 2022-03-30 ENCOUNTER — Other Ambulatory Visit (HOSPITAL_COMMUNITY): Payer: Self-pay

## 2022-03-30 DIAGNOSIS — H5711 Ocular pain, right eye: Secondary | ICD-10-CM | POA: Diagnosis not present

## 2022-03-30 MED ORDER — HYDROCORTISONE ACETATE 25 MG RE SUPP
25.0000 mg | Freq: Two times a day (BID) | RECTAL | 3 refills | Status: AC | PRN
  Filled 2022-03-30 – 2022-04-02 (×2): qty 60, 30d supply, fill #0
  Filled 2022-09-01 (×2): qty 60, 30d supply, fill #1

## 2022-04-02 ENCOUNTER — Other Ambulatory Visit (HOSPITAL_COMMUNITY): Payer: Self-pay

## 2022-04-02 ENCOUNTER — Other Ambulatory Visit: Payer: Self-pay

## 2022-04-07 DIAGNOSIS — H8110 Benign paroxysmal vertigo, unspecified ear: Secondary | ICD-10-CM | POA: Diagnosis not present

## 2022-04-07 DIAGNOSIS — M545 Low back pain, unspecified: Secondary | ICD-10-CM | POA: Diagnosis not present

## 2022-04-07 DIAGNOSIS — G43719 Chronic migraine without aura, intractable, without status migrainosus: Secondary | ICD-10-CM | POA: Diagnosis not present

## 2022-04-07 DIAGNOSIS — G8929 Other chronic pain: Secondary | ICD-10-CM | POA: Diagnosis not present

## 2022-04-07 DIAGNOSIS — R202 Paresthesia of skin: Secondary | ICD-10-CM | POA: Diagnosis not present

## 2022-04-09 ENCOUNTER — Other Ambulatory Visit: Payer: Self-pay

## 2022-04-09 ENCOUNTER — Other Ambulatory Visit (HOSPITAL_COMMUNITY): Payer: Self-pay

## 2022-04-09 MED ORDER — TOPIRAMATE ER 50 MG PO CAP24
50.0000 mg | ORAL_CAPSULE | Freq: Every day | ORAL | 3 refills | Status: DC
  Filled 2022-04-09: qty 30, 30d supply, fill #0
  Filled 2022-04-13 – 2022-05-05 (×2): qty 30, 30d supply, fill #1
  Filled 2022-06-06: qty 30, 30d supply, fill #2
  Filled 2022-06-29 – 2022-07-06 (×2): qty 30, 30d supply, fill #3

## 2022-04-13 ENCOUNTER — Other Ambulatory Visit (HOSPITAL_COMMUNITY): Payer: Self-pay

## 2022-04-13 ENCOUNTER — Other Ambulatory Visit: Payer: Self-pay

## 2022-05-05 ENCOUNTER — Other Ambulatory Visit (HOSPITAL_COMMUNITY): Payer: Self-pay

## 2022-05-05 ENCOUNTER — Other Ambulatory Visit: Payer: Self-pay

## 2022-05-18 ENCOUNTER — Other Ambulatory Visit (HOSPITAL_COMMUNITY): Payer: Self-pay

## 2022-05-21 ENCOUNTER — Ambulatory Visit: Payer: Commercial Managed Care - PPO | Admitting: Neurosurgery

## 2022-05-21 ENCOUNTER — Encounter: Payer: Self-pay | Admitting: Neurosurgery

## 2022-05-21 VITALS — BP 139/81 | HR 91 | Ht 72.0 in | Wt 183.4 lb

## 2022-05-21 DIAGNOSIS — M5416 Radiculopathy, lumbar region: Secondary | ICD-10-CM | POA: Diagnosis not present

## 2022-05-21 NOTE — Addendum Note (Signed)
Addended by: Berdine Addison on: 05/21/2022 10:00 AM   Modules accepted: Orders

## 2022-05-21 NOTE — Progress Notes (Signed)
Referring Physician:  No referring provider defined for this encounter.  Primary Physician:  Vira Blanco, MD  History of Present Illness: 05/21/2022 Kathleen Diaz is here today with a chief complaint of back and left leg pain.  She has left greater than right leg pain.  This has occurred in the past in 2022 for which she underwent physical therapy and an injection.  She was doing well until she recently hurt her back while working with her Physiological scientist.  She reports left-sided low back pain down her posterior thigh sometimes her medial thigh, posterior calf to heel, and on the underside of her foot to her second and third toe.  Standing and walking makes it worse.  Laying down and walking on her tiptoes makes it better.  She started a steroid pack which has made her pain better.  She has also limited her exercise for now.  She was doing a farmers walk with 20 pound weights when she felt something give in her back.   Bowel/Bladder Dysfunction: none  Conservative measures:  Physical therapy:  participated in March 2022; has been working with a Insurance underwriter and has been doing home exercises Multimodal medical therapy including regular antiinflammatories:  flexeril, gabapentin, ibuprofen, steroid taper Injections:  has received epidural steroid injections in the past (02/2020)   Past Surgery: denies  Kathleen Diaz has no symptoms of cervical myelopathy.  The symptoms are causing a significant impact on the patient's life.   I have utilized the care everywhere function in epic to review the outside records available from external health systems.  Review of Systems:  A 10 point review of systems is negative, except for the pertinent positives and negatives detailed in the HPI.  Past Medical History: Past Medical History:  Diagnosis Date   Anemia    Chronic bilateral low back pain with left-sided sciatica    DDD (degenerative disc disease), lumbar     External hemorrhoids    GERD (gastroesophageal reflux disease)    History of hiatal hernia    small   Hyperprolactinemia (HCC)    Intervertebral disc disorder with radiculopathy of lumbar region    Migraine with aura and without status migrainosus    Migraines    New daily persistent headache    Paresthesia of hand, bilateral    Paresthesia of left foot    Pituitary adenoma (HCC)    Prehypertension    S/P selective transsphenoidal pituitary adenomectomy    Shift work sleep disorder     Past Surgical History: Past Surgical History:  Procedure Laterality Date   BREAST EXCISIONAL BIOPSY Bilateral    age 7   COLONOSCOPY WITH PROPOFOL N/A 03/17/2020   Procedure: COLONOSCOPY WITH PROPOFOL;  Surgeon: Toledo, [name], MD;  Location: [facility];  Service: Gastroenterology;  Laterality: N/A;   DE QUERVAIN'S RELEASE Bilateral    DILATATION & CURETTAGE/HYSTEROSCOPY WITH MYOSURE N/A 02/16/2021   Procedure: [place];  Surgeon: Benjaman Kindler, MD;  Location: [facility];  Service: Gynecology;  Laterality: N/A;   ESOPHAGOGASTRODUODENOSCOPY (EGD) WITH PROPOFOL N/A 03/17/2020   Procedure: ESOPHAGOGASTRODUODENOSCOPY (EGD) WITH PROPOFOL;  Surgeon: Toledo, [name], MD;  Location: [facility];  Service: Gastroenterology;  Laterality: N/A;   HYSTEROSCOPY WITH D & C N/A 02/16/2021   Procedure: DILATATION AND CURETTAGE /HYSTEROSCOPY;  Surgeon: Benjaman Kindler, MD;  Location: [facility];  Service: Gynecology;  Laterality: N/A;   PITUITARY SURGERY  2000   resection  Allergies: Allergies as of 05/21/2022 - Review Complete 08/31/2021  Allergen Reaction Noted   Tape  02/04/2021    Medications: Current Meds  Medication Sig   methylPREDNISolone (MEDROL) 4 MG tablet Take by mouth as directed.    Social History: Social History   Tobacco Use   Smoking status: Never   Smokeless tobacco: Never  Vaping Use   Vaping Use: Never used   Substance Use Topics   Alcohol use: Not Currently   Drug use: Never    Family Medical History: Family History  Problem Relation Age of Onset   Breast cancer Maternal Aunt    Breast cancer Maternal Aunt    Breast cancer Paternal Aunt    Breast cancer Paternal Aunt    Breast cancer Cousin    Breast cancer Cousin     Physical Examination: Vitals:   05/21/22 0913  BP: 139/81  Pulse: 91    General: Patient is well developed, well nourished, calm, collected, and in no apparent distress. Attention to examination is appropriate.  Neck:   Supple.  Full range of motion.  Respiratory: Patient is breathing without any difficulty.   NEUROLOGICAL:     Awake, alert, oriented to person, place, and time.  Speech is clear and fluent.   Cranial Nerves: Pupils equal round and reactive to light.  Facial tone is symmetric.  Facial sensation is symmetric. Shoulder shrug is symmetric. Tongue protrusion is midline.  There is no pronator drift.  ROM of spine: full.    Strength: Side Biceps Triceps Deltoid Interossei Grip Wrist Ext. Wrist Flex.  R '5 5 5 5 5 5 5  '$ L '5 5 5 5 5 5 5   '$ Side Iliopsoas Quads Hamstring PF DF EHL  R '5 5 5 5 5 5  '$ L '5 5 5 5 5 5   '$ Reflexes are 1+ and symmetric at the biceps, triceps, brachioradialis, patella and achilles.   Hoffman's is absent.   Bilateral upper and lower extremity sensation is intact to light touch.    No evidence of dysmetria noted.  Gait is normal.   Positive straight leg on the left at 60 degrees   Medical Decision Making  Imaging: MRI L spine 03/18/2021 IMPRESSION: Comparison is made to the prior lumbar spine MRI of 02/13/2020.   Lumbar spondylosis, as outlined with findings most notably as follows.   At L4-L5, there is mild disc degeneration. Disc bulge. A superimposed broad-based left subarticular/foraminal disc protrusion has slightly progressed. Facet arthrosis/ligamentum flavum hypertrophy. As before, the disc protrusion  contributes to left subarticular stenosis, encroaching upon the descending left L5 nerve root. Correlate for left L5 radiculopathy. No significant central canal or foraminal stenosis.   Lumbar spondylosis has otherwise not significantly changed. No significant spinal canal or foraminal stenosis at the remaining levels.     Electronically Signed   By: Kellie Simmering D.O.   On: 03/18/2021 19:32  I have personally reviewed the images and agree with the above interpretation.  Assessment and Plan: Kathleen Diaz is a pleasant 57 y.o. female with acute lumbar radiculopathy that may be improving at this time.  She is previously had an episode of this.  Will start physical therapy and she will continue her steroid taper.  After she finishes her steroid taper, she will transition to nonsteroidal anti-inflammatory medication.  I will see her back in a few weeks.  If she worsens in the meantime, she will let me know and we will proceed with imaging.  Thank you for involving me in the care of this patient.      Antino Mayabb K. Izora Ribas MD, [facility] Neurosurgery

## 2022-05-25 DIAGNOSIS — M545 Low back pain, unspecified: Secondary | ICD-10-CM | POA: Diagnosis not present

## 2022-05-26 ENCOUNTER — Other Ambulatory Visit: Payer: Self-pay

## 2022-05-26 ENCOUNTER — Other Ambulatory Visit (HOSPITAL_COMMUNITY): Payer: Self-pay

## 2022-05-27 DIAGNOSIS — M545 Low back pain, unspecified: Secondary | ICD-10-CM | POA: Diagnosis not present

## 2022-05-28 ENCOUNTER — Other Ambulatory Visit (HOSPITAL_COMMUNITY): Payer: Self-pay

## 2022-05-31 ENCOUNTER — Other Ambulatory Visit (HOSPITAL_COMMUNITY): Payer: Self-pay

## 2022-06-01 ENCOUNTER — Other Ambulatory Visit: Payer: Self-pay

## 2022-06-01 ENCOUNTER — Other Ambulatory Visit (HOSPITAL_COMMUNITY): Payer: Self-pay

## 2022-06-03 ENCOUNTER — Other Ambulatory Visit: Payer: Self-pay

## 2022-06-03 DIAGNOSIS — M545 Low back pain, unspecified: Secondary | ICD-10-CM | POA: Diagnosis not present

## 2022-06-04 ENCOUNTER — Other Ambulatory Visit: Payer: Self-pay

## 2022-06-06 ENCOUNTER — Other Ambulatory Visit (HOSPITAL_COMMUNITY): Payer: Self-pay

## 2022-06-07 ENCOUNTER — Other Ambulatory Visit: Payer: Self-pay

## 2022-06-07 ENCOUNTER — Other Ambulatory Visit (HOSPITAL_COMMUNITY): Payer: Self-pay

## 2022-06-15 DIAGNOSIS — M545 Low back pain, unspecified: Secondary | ICD-10-CM | POA: Diagnosis not present

## 2022-06-22 DIAGNOSIS — M545 Low back pain, unspecified: Secondary | ICD-10-CM | POA: Diagnosis not present

## 2022-06-29 ENCOUNTER — Other Ambulatory Visit (HOSPITAL_COMMUNITY): Payer: Self-pay

## 2022-07-06 ENCOUNTER — Other Ambulatory Visit (HOSPITAL_COMMUNITY): Payer: Self-pay

## 2022-07-06 DIAGNOSIS — M545 Low back pain, unspecified: Secondary | ICD-10-CM | POA: Diagnosis not present

## 2022-07-08 ENCOUNTER — Other Ambulatory Visit: Payer: Self-pay

## 2022-07-08 ENCOUNTER — Other Ambulatory Visit (HOSPITAL_COMMUNITY): Payer: Self-pay

## 2022-07-08 MED ORDER — PROPRANOLOL HCL ER 60 MG PO CP24
60.0000 mg | ORAL_CAPSULE | Freq: Every day | ORAL | 5 refills | Status: DC
  Filled 2022-07-08: qty 30, 30d supply, fill #0
  Filled 2022-08-04: qty 30, 30d supply, fill #1
  Filled 2022-09-01 (×2): qty 30, 30d supply, fill #2
  Filled 2022-10-04: qty 30, 30d supply, fill #3

## 2022-07-12 ENCOUNTER — Other Ambulatory Visit (HOSPITAL_COMMUNITY): Payer: Self-pay

## 2022-07-13 ENCOUNTER — Encounter: Payer: Self-pay | Admitting: Neurosurgery

## 2022-07-13 ENCOUNTER — Ambulatory Visit: Payer: Commercial Managed Care - PPO | Admitting: Neurosurgery

## 2022-07-13 VITALS — BP 123/70 | HR 84 | Ht 72.0 in | Wt 188.0 lb

## 2022-07-13 DIAGNOSIS — M545 Low back pain, unspecified: Secondary | ICD-10-CM | POA: Diagnosis not present

## 2022-07-13 DIAGNOSIS — G8929 Other chronic pain: Secondary | ICD-10-CM | POA: Diagnosis not present

## 2022-07-13 NOTE — Progress Notes (Signed)
Referring Physician:  [place] [address] [place],  [place] [address]  Primary Physician:  Stefano Gaul, MD  History of Present Illness: 07/13/2022 Dr. Para March returns to see me.  Her back pain has improved.  She has been working with a physical therapist.  She we will begin slowly returning to her trainer.  She has no radicular symptoms.  She did note some intermittent tingling in her fingers but it is not persistent and does not extend above her fingers.  She is back to walking 2 miles.  05/21/2022 Kathleen Diaz is here today with a chief complaint of back and left leg pain.  She has left greater than right leg pain.  This has occurred in the past in 2022 for which she underwent physical therapy and an injection.  She was doing well until she recently hurt her back while working with her Systems analyst.  She reports left-sided low back pain down her posterior thigh sometimes her medial thigh, posterior calf to heel, and on the underside of her foot to her second and third toe.  Standing and walking makes it worse.  Laying down and walking on her tiptoes makes it better.  She started a steroid pack which has made her pain better.  She has also limited her exercise for now.  She was doing a farmers walk with 20 pound weights when she felt something give in her back.   Bowel/Bladder Dysfunction: none  Conservative measures:  Physical therapy:  participated in March 2022; has been working with a Pharmacist, hospital and has been doing home exercises Multimodal medical therapy including regular antiinflammatories:  flexeril, gabapentin, ibuprofen, steroid taper Injections:  has received epidural steroid injections in the past (02/2020)   Past Surgery: denies  Kathleen Diaz has no symptoms of cervical myelopathy.  The symptoms are causing a significant impact on the patient's life.   I have utilized the care everywhere function  in epic to review the outside records available from external health systems.  Review of Systems:  A 10 point review of systems is negative, except for the pertinent positives and negatives detailed in the HPI.  Past Medical History: Past Medical History:  Diagnosis Date   Anemia    Chronic bilateral low back pain with left-sided sciatica    DDD (degenerative disc disease), lumbar    External hemorrhoids    GERD (gastroesophageal reflux disease)    History of hiatal hernia    small   Hyperprolactinemia    Intervertebral disc disorder with radiculopathy of lumbar region    Migraine with aura and without status migrainosus    Migraines    New daily persistent headache    Paresthesia of hand, bilateral    Paresthesia of left foot    Pituitary adenoma    Prehypertension    S/P selective transsphenoidal pituitary adenomectomy    Shift work sleep disorder     Past Surgical History: Past Surgical History:  Procedure Laterality Date   BREAST EXCISIONAL BIOPSY Bilateral    age 26   COLONOSCOPY WITH PROPOFOL N/A 03/17/2020   Procedure: COLONOSCOPY WITH PROPOFOL;  Surgeon: Toledo, Boykin Nearing, MD;  Location: [facility];  Service: Gastroenterology;  Laterality: N/A;   DE QUERVAIN'S RELEASE Bilateral    DILATATION & CURETTAGE/HYSTEROSCOPY WITH MYOSURE N/A 02/16/2021   Procedure: DILATATION & CURETTAGE/HYSTEROSCOPY WITH MYOSURE - MYOMECTOMY;  Surgeon: Christeen Douglas, MD;  Location: [facility];  Service: Gynecology;  Laterality: N/A;  ESOPHAGOGASTRODUODENOSCOPY (EGD) WITH PROPOFOL N/A 03/17/2020   Procedure: ESOPHAGOGASTRODUODENOSCOPY (EGD) WITH PROPOFOL;  Surgeon: Toledo, Boykin Nearing, MD;  Location: [facility];  Service: Gastroenterology;  Laterality: N/A;   HYSTEROSCOPY WITH D & C N/A 02/16/2021   Procedure: DILATATION AND CURETTAGE /HYSTEROSCOPY;  Surgeon: Christeen Douglas, MD;  Location: [facility];  Service: Gynecology;  Laterality: N/A;   PITUITARY SURGERY  2000   resection     Allergies: Allergies as of 07/13/2022 - Review Complete 07/13/2022  Allergen Reaction Noted   Tape  02/04/2021    Medications: No outpatient medications have been marked as taking for the 07/13/22 encounter (Office Visit) with Venetia Night, MD.    Social History: Social History   Tobacco Use   Smoking status: Never   Smokeless tobacco: Never  Vaping Use   Vaping Use: Never used  Substance Use Topics   Alcohol use: Not Currently   Drug use: Never    Family Medical History: Family History  Problem Relation Age of Onset   Breast cancer Maternal Aunt    Breast cancer Maternal Aunt    Breast cancer Paternal Aunt    Breast cancer Paternal Aunt    Breast cancer Cousin    Breast cancer Cousin     Physical Examination: Vitals:   07/13/22 0812  BP: 123/70  Pulse: 84  SpO2: 99%    General: Patient is well developed, well nourished, calm, collected, and in no apparent distress. Attention to examination is appropriate.  Neck:   Supple.  Full range of motion.  Respiratory: Patient is breathing without any difficulty.   NEUROLOGICAL:     Awake, alert, oriented to person, place, and time.  Speech is clear and fluent.   Cranial Nerves: Pupils equal round and reactive to light.  Facial tone is symmetric.  Facial sensation is symmetric. Shoulder shrug is symmetric. Tongue protrusion is midline.  There is no pronator drift.  ROM of spine: full.    Strength: Side Biceps Triceps Deltoid Interossei Grip Wrist Ext. Wrist Flex.  R L Side Iliopsoas Quads Hamstring PF DF EHL  R L Reflexes are 1+ and symmetric at the biceps, triceps, brachioradialis, patella and achilles.   Hoffman's is absent.   Bilateral upper and lower extremity sensation is intact to light touch.    No evidence of dysmetria noted.  Gait is normal.   Positive straight leg on the left at 60 degrees   Medical Decision  Making  Imaging: MRI L spine 03/18/2021 IMPRESSION: Comparison is made to the prior lumbar spine MRI of 02/13/2020.   Lumbar spondylosis, as outlined with findings most notably as follows.   At L4-L5, there is mild disc degeneration. Disc bulge. A superimposed broad-based left subarticular/foraminal disc protrusion has slightly progressed. Facet arthrosis/ligamentum flavum hypertrophy. As before, the disc protrusion contributes to left subarticular stenosis, encroaching upon the descending left L5 nerve root. Correlate for left L5 radiculopathy. No significant central canal or foraminal stenosis.   Lumbar spondylosis has otherwise not significantly changed. No significant spinal canal or foraminal stenosis at the remaining levels.     Electronically Signed   By: Jackey Loge D.O.   On: 03/18/2021 19:32  I have personally reviewed the images and agree with the above interpretation.  Assessment and Plan: Kathleen Diaz is a pleasant 57 y.o. female with  lower back pain that has been improving.  She is doing much better than she was when I last saw her.  She will slowly transition back to her trainer and be very careful with the activities and exercise as she pursues.  She is traveling to [place] to see her extended family.  I have cautioned her to be careful with how much she lifts and be very protective of her back.  I will see her back on an as-needed basis.        Kathleen Limberg K. Myer Haff MD, [facility] Neurosurgery

## 2022-07-14 ENCOUNTER — Other Ambulatory Visit: Payer: Self-pay

## 2022-07-14 ENCOUNTER — Other Ambulatory Visit (HOSPITAL_COMMUNITY): Payer: Self-pay

## 2022-07-14 MED ORDER — ESZOPICLONE 3 MG PO TABS: 3.0000 mg | ORAL_TABLET | Freq: Every evening | ORAL | 0 refills | Status: AC | PRN

## 2022-07-14 MED ORDER — ESCITALOPRAM OXALATE 10 MG PO TABS
10.0000 mg | ORAL_TABLET | Freq: Every day | ORAL | 0 refills | Status: AC
  Filled 2022-07-14: qty 90, 90d supply, fill #0

## 2022-07-14 MED ORDER — GABAPENTIN 100 MG PO CAPS
100.0000 mg | ORAL_CAPSULE | Freq: Every day | ORAL | 0 refills | Status: AC
  Filled 2022-07-14: qty 270, 90d supply, fill #0

## 2022-07-14 MED ORDER — SUMATRIPTAN SUCCINATE 100 MG PO TABS
ORAL_TABLET | ORAL | 0 refills | Status: AC
  Filled 2022-07-14: qty 27, 90d supply, fill #0

## 2022-08-02 ENCOUNTER — Other Ambulatory Visit: Payer: Self-pay

## 2022-08-02 ENCOUNTER — Other Ambulatory Visit: Payer: Self-pay | Admitting: Family Medicine

## 2022-08-02 ENCOUNTER — Other Ambulatory Visit (HOSPITAL_COMMUNITY): Payer: Self-pay

## 2022-08-02 DIAGNOSIS — Z1231 Encounter for screening mammogram for malignant neoplasm of breast: Secondary | ICD-10-CM

## 2022-08-03 DIAGNOSIS — M545 Low back pain, unspecified: Secondary | ICD-10-CM | POA: Diagnosis not present

## 2022-08-04 ENCOUNTER — Other Ambulatory Visit (HOSPITAL_COMMUNITY): Payer: Self-pay

## 2022-08-09 ENCOUNTER — Other Ambulatory Visit (HOSPITAL_COMMUNITY): Payer: Self-pay

## 2022-08-09 MED ORDER — TOPIRAMATE ER 50 MG PO CAP24
50.0000 mg | ORAL_CAPSULE | Freq: Every day | ORAL | 3 refills | Status: DC
  Filled 2022-08-09: qty 30, 30d supply, fill #0
  Filled 2022-09-01 – 2022-10-04 (×2): qty 30, 30d supply, fill #1

## 2022-08-17 ENCOUNTER — Other Ambulatory Visit: Payer: Self-pay

## 2022-08-17 ENCOUNTER — Other Ambulatory Visit (HOSPITAL_COMMUNITY): Payer: Self-pay

## 2022-08-17 DIAGNOSIS — Z136 Encounter for screening for cardiovascular disorders: Secondary | ICD-10-CM | POA: Diagnosis not present

## 2022-08-17 DIAGNOSIS — G4726 Circadian rhythm sleep disorder, shift work type: Secondary | ICD-10-CM | POA: Diagnosis not present

## 2022-08-17 DIAGNOSIS — G43109 Migraine with aura, not intractable, without status migrainosus: Secondary | ICD-10-CM | POA: Diagnosis not present

## 2022-08-17 DIAGNOSIS — M5442 Lumbago with sciatica, left side: Secondary | ICD-10-CM | POA: Diagnosis not present

## 2022-08-17 DIAGNOSIS — F4323 Adjustment disorder with mixed anxiety and depressed mood: Secondary | ICD-10-CM | POA: Diagnosis not present

## 2022-08-17 DIAGNOSIS — Z1331 Encounter for screening for depression: Secondary | ICD-10-CM | POA: Diagnosis not present

## 2022-08-17 DIAGNOSIS — Z0000 Encounter for general adult medical examination without abnormal findings: Secondary | ICD-10-CM | POA: Diagnosis not present

## 2022-08-17 DIAGNOSIS — Z131 Encounter for screening for diabetes mellitus: Secondary | ICD-10-CM | POA: Diagnosis not present

## 2022-08-17 DIAGNOSIS — Z1330 Encounter for screening examination for mental health and behavioral disorders, unspecified: Secondary | ICD-10-CM | POA: Diagnosis not present

## 2022-08-17 DIAGNOSIS — Z79899 Other long term (current) drug therapy: Secondary | ICD-10-CM | POA: Diagnosis not present

## 2022-08-17 MED ORDER — ESZOPICLONE 3 MG PO TABS
3.0000 mg | ORAL_TABLET | Freq: Every evening | ORAL | 0 refills | Status: AC | PRN
  Filled 2022-09-01: qty 90, 90d supply, fill #0

## 2022-08-17 MED ORDER — ESCITALOPRAM OXALATE 10 MG PO TABS
10.0000 mg | ORAL_TABLET | Freq: Every day | ORAL | 1 refills | Status: AC
  Filled 2022-10-08: qty 90, 90d supply, fill #0

## 2022-08-17 MED ORDER — GABAPENTIN 100 MG PO CAPS
100.0000 mg | ORAL_CAPSULE | Freq: Every evening | ORAL | 6 refills | Status: AC
  Filled 2022-08-17 – 2022-12-07 (×4): qty 270, 90d supply, fill #0

## 2022-08-17 MED ORDER — ESZOPICLONE 3 MG PO TABS
3.0000 mg | ORAL_TABLET | Freq: Every evening | ORAL | 0 refills | Status: AC
  Filled 2022-08-17 – 2022-12-03 (×2): qty 90, 90d supply, fill #0

## 2022-08-17 MED ORDER — SUMATRIPTAN SUCCINATE 100 MG PO TABS
100.0000 mg | ORAL_TABLET | Freq: Every day | ORAL | 0 refills | Status: AC | PRN
  Filled 2022-08-17: qty 27, 14d supply, fill #0

## 2022-08-25 ENCOUNTER — Ambulatory Visit
Admission: RE | Admit: 2022-08-25 | Discharge: 2022-08-25 | Disposition: A | Discharge status: Home / Self Care | Payer: Commercial Managed Care - PPO | Source: Ambulatory Visit | Attending: Family Medicine | Admitting: Family Medicine

## 2022-08-25 DIAGNOSIS — Z1231 Encounter for screening mammogram for malignant neoplasm of breast: Secondary | ICD-10-CM | POA: Insufficient documentation

## 2022-09-01 ENCOUNTER — Other Ambulatory Visit: Payer: Self-pay

## 2022-09-23 DIAGNOSIS — M545 Low back pain, unspecified: Secondary | ICD-10-CM | POA: Diagnosis not present

## 2022-10-05 ENCOUNTER — Other Ambulatory Visit: Payer: Self-pay

## 2022-10-08 ENCOUNTER — Other Ambulatory Visit: Payer: Self-pay

## 2022-10-08 ENCOUNTER — Other Ambulatory Visit (HOSPITAL_COMMUNITY): Payer: Self-pay

## 2022-11-01 DIAGNOSIS — R221 Localized swelling, mass and lump, neck: Secondary | ICD-10-CM | POA: Diagnosis not present

## 2022-11-01 DIAGNOSIS — G43719 Chronic migraine without aura, intractable, without status migrainosus: Secondary | ICD-10-CM | POA: Diagnosis not present

## 2022-11-01 DIAGNOSIS — R202 Paresthesia of skin: Secondary | ICD-10-CM | POA: Diagnosis not present

## 2022-12-02 ENCOUNTER — Other Ambulatory Visit: Payer: Self-pay

## 2022-12-03 ENCOUNTER — Other Ambulatory Visit: Payer: Self-pay

## 2022-12-03 ENCOUNTER — Other Ambulatory Visit (HOSPITAL_COMMUNITY): Payer: Self-pay

## 2022-12-05 ENCOUNTER — Other Ambulatory Visit: Payer: Self-pay

## 2022-12-05 MED ORDER — ESZOPICLONE 3 MG PO TABS
3.0000 mg | ORAL_TABLET | Freq: Every evening | ORAL | 1 refills | Status: DC | PRN
Start: 2022-12-04 — End: 2023-08-25
  Filled 2023-02-28: qty 90, 90d supply, fill #0
  Filled 2023-05-24 – 2023-05-27 (×2): qty 90, 90d supply, fill #1

## 2022-12-07 ENCOUNTER — Other Ambulatory Visit (HOSPITAL_COMMUNITY): Payer: Self-pay

## 2022-12-07 ENCOUNTER — Other Ambulatory Visit: Payer: Self-pay

## 2022-12-07 ENCOUNTER — Other Ambulatory Visit: Payer: Self-pay | Admitting: Neurology

## 2022-12-07 DIAGNOSIS — R221 Localized swelling, mass and lump, neck: Secondary | ICD-10-CM

## 2022-12-07 DIAGNOSIS — R202 Paresthesia of skin: Secondary | ICD-10-CM

## 2022-12-08 ENCOUNTER — Encounter: Payer: Self-pay | Admitting: Neurology

## 2022-12-20 DIAGNOSIS — H524 Presbyopia: Secondary | ICD-10-CM | POA: Diagnosis not present

## 2022-12-20 DIAGNOSIS — H5213 Myopia, bilateral: Secondary | ICD-10-CM | POA: Diagnosis not present

## 2022-12-20 DIAGNOSIS — H43813 Vitreous degeneration, bilateral: Secondary | ICD-10-CM | POA: Diagnosis not present

## 2022-12-20 DIAGNOSIS — H04123 Dry eye syndrome of bilateral lacrimal glands: Secondary | ICD-10-CM | POA: Diagnosis not present

## 2022-12-22 ENCOUNTER — Other Ambulatory Visit (HOSPITAL_COMMUNITY): Payer: Self-pay

## 2022-12-23 ENCOUNTER — Ambulatory Visit
Admission: RE | Admit: 2022-12-23 | Discharge: 2022-12-23 | Disposition: A | Discharge status: Home / Self Care | Payer: Commercial Managed Care - PPO | Source: Ambulatory Visit | Attending: Neurology | Admitting: Neurology

## 2022-12-23 DIAGNOSIS — R221 Localized swelling, mass and lump, neck: Secondary | ICD-10-CM | POA: Diagnosis not present

## 2022-12-23 DIAGNOSIS — R202 Paresthesia of skin: Secondary | ICD-10-CM

## 2022-12-24 ENCOUNTER — Other Ambulatory Visit (HOSPITAL_COMMUNITY): Payer: Self-pay

## 2022-12-24 ENCOUNTER — Other Ambulatory Visit: Payer: Self-pay

## 2022-12-24 MED ORDER — CYCLOBENZAPRINE HCL 10 MG PO TABS
10.0000 mg | ORAL_TABLET | Freq: Three times a day (TID) | ORAL | 11 refills | Status: AC | PRN
  Filled 2022-12-24: qty 90, 30d supply, fill #0

## 2023-02-01 ENCOUNTER — Other Ambulatory Visit (HOSPITAL_COMMUNITY): Payer: Self-pay

## 2023-02-01 DIAGNOSIS — R202 Paresthesia of skin: Secondary | ICD-10-CM | POA: Diagnosis not present

## 2023-02-01 DIAGNOSIS — G43719 Chronic migraine without aura, intractable, without status migrainosus: Secondary | ICD-10-CM | POA: Diagnosis not present

## 2023-02-01 DIAGNOSIS — G8929 Other chronic pain: Secondary | ICD-10-CM | POA: Diagnosis not present

## 2023-02-01 DIAGNOSIS — M545 Low back pain, unspecified: Secondary | ICD-10-CM | POA: Diagnosis not present

## 2023-02-01 DIAGNOSIS — G44229 Chronic tension-type headache, not intractable: Secondary | ICD-10-CM | POA: Diagnosis not present

## 2023-02-01 MED ORDER — QULIPTA 60 MG PO TABS
60.0000 mg | ORAL_TABLET | Freq: Every day | ORAL | 3 refills | Status: DC
Start: 2023-02-01 — End: 2024-01-16
  Filled 2023-02-01: qty 30, 30d supply, fill #0

## 2023-02-11 ENCOUNTER — Other Ambulatory Visit: Payer: Self-pay

## 2023-02-11 ENCOUNTER — Other Ambulatory Visit (HOSPITAL_COMMUNITY): Payer: Self-pay

## 2023-02-15 ENCOUNTER — Other Ambulatory Visit (HOSPITAL_COMMUNITY): Payer: Self-pay

## 2023-02-28 ENCOUNTER — Other Ambulatory Visit: Payer: Self-pay

## 2023-03-07 ENCOUNTER — Other Ambulatory Visit (HOSPITAL_COMMUNITY): Payer: Self-pay

## 2023-03-07 ENCOUNTER — Other Ambulatory Visit: Payer: Self-pay

## 2023-03-07 MED ORDER — ESCITALOPRAM OXALATE 10 MG PO TABS
10.0000 mg | ORAL_TABLET | Freq: Every day | ORAL | 3 refills | Status: AC
  Filled 2023-03-07: qty 90, 90d supply, fill #0

## 2023-03-07 MED ORDER — QULIPTA 60 MG PO TABS
60.0000 mg | ORAL_TABLET | Freq: Every day | ORAL | 1 refills | Status: AC
  Filled 2023-03-07: qty 90, 90d supply, fill #0
  Filled 2023-06-16: qty 90, 90d supply, fill #1

## 2023-03-07 MED ORDER — SUMATRIPTAN SUCCINATE 100 MG PO TABS
ORAL_TABLET | ORAL | 4 refills | Status: AC
  Filled 2023-03-07: qty 27, 90d supply, fill #0

## 2023-03-07 MED ORDER — GABAPENTIN 100 MG PO CAPS
100.0000 mg | ORAL_CAPSULE | Freq: Every day | ORAL | 3 refills | Status: AC
  Filled 2023-03-07: qty 270, 90d supply, fill #0

## 2023-03-07 MED ORDER — CYCLOBENZAPRINE HCL 10 MG PO TABS
10.0000 mg | ORAL_TABLET | Freq: Three times a day (TID) | ORAL | 5 refills | Status: DC | PRN
  Filled 2023-03-07: qty 90, 30d supply, fill #0

## 2023-03-09 DIAGNOSIS — Z111 Encounter for screening for respiratory tuberculosis: Secondary | ICD-10-CM | POA: Diagnosis not present

## 2023-05-24 ENCOUNTER — Other Ambulatory Visit: Payer: Self-pay

## 2023-05-27 ENCOUNTER — Other Ambulatory Visit: Payer: Self-pay

## 2023-06-11 ENCOUNTER — Other Ambulatory Visit (HOSPITAL_COMMUNITY): Payer: Self-pay

## 2023-06-11 MED ORDER — ESCITALOPRAM OXALATE 10 MG PO TABS
10.0000 mg | ORAL_TABLET | Freq: Every day | ORAL | 3 refills | Status: AC
  Filled 2023-06-11: qty 90, 90d supply, fill #0

## 2023-06-11 MED ORDER — SUMATRIPTAN SUCCINATE 100 MG PO TABS
100.0000 mg | ORAL_TABLET | Freq: Every day | ORAL | 3 refills | Status: AC | PRN
  Filled 2023-06-11: qty 27, 45d supply, fill #0
  Filled 2024-01-16: qty 27, 45d supply, fill #1

## 2023-06-16 ENCOUNTER — Other Ambulatory Visit: Payer: Self-pay

## 2023-06-16 ENCOUNTER — Other Ambulatory Visit (HOSPITAL_COMMUNITY): Payer: Self-pay

## 2023-06-16 DIAGNOSIS — H8110 Benign paroxysmal vertigo, unspecified ear: Secondary | ICD-10-CM | POA: Diagnosis not present

## 2023-06-16 DIAGNOSIS — M545 Low back pain, unspecified: Secondary | ICD-10-CM | POA: Diagnosis not present

## 2023-06-16 DIAGNOSIS — G8929 Other chronic pain: Secondary | ICD-10-CM | POA: Diagnosis not present

## 2023-06-16 DIAGNOSIS — G43719 Chronic migraine without aura, intractable, without status migrainosus: Secondary | ICD-10-CM | POA: Diagnosis not present

## 2023-06-16 DIAGNOSIS — R202 Paresthesia of skin: Secondary | ICD-10-CM | POA: Diagnosis not present

## 2023-06-16 MED ORDER — ZONISAMIDE 100 MG PO CAPS
100.0000 mg | ORAL_CAPSULE | ORAL | 3 refills | Status: DC
  Filled 2023-06-16: qty 45, 90d supply, fill #0

## 2023-06-16 MED ORDER — ZONISAMIDE 100 MG PO CAPS
100.0000 mg | ORAL_CAPSULE | Freq: Every day | ORAL | 3 refills | Status: AC
  Filled 2023-06-16: qty 90, 90d supply, fill #0

## 2023-08-02 ENCOUNTER — Other Ambulatory Visit (HOSPITAL_COMMUNITY): Payer: Self-pay

## 2023-08-16 ENCOUNTER — Other Ambulatory Visit: Payer: Self-pay | Admitting: Family Medicine

## 2023-08-16 DIAGNOSIS — Z1231 Encounter for screening mammogram for malignant neoplasm of breast: Secondary | ICD-10-CM

## 2023-08-25 ENCOUNTER — Other Ambulatory Visit: Payer: Self-pay

## 2023-08-25 ENCOUNTER — Other Ambulatory Visit (HOSPITAL_COMMUNITY): Payer: Self-pay

## 2023-08-25 DIAGNOSIS — Z131 Encounter for screening for diabetes mellitus: Secondary | ICD-10-CM | POA: Diagnosis not present

## 2023-08-25 DIAGNOSIS — Z136 Encounter for screening for cardiovascular disorders: Secondary | ICD-10-CM | POA: Diagnosis not present

## 2023-08-25 DIAGNOSIS — Z23 Encounter for immunization: Secondary | ICD-10-CM | POA: Diagnosis not present

## 2023-08-25 DIAGNOSIS — R202 Paresthesia of skin: Secondary | ICD-10-CM | POA: Diagnosis not present

## 2023-08-25 DIAGNOSIS — G4726 Circadian rhythm sleep disorder, shift work type: Secondary | ICD-10-CM | POA: Diagnosis not present

## 2023-08-25 DIAGNOSIS — Z1331 Encounter for screening for depression: Secondary | ICD-10-CM | POA: Diagnosis not present

## 2023-08-25 DIAGNOSIS — Z1330 Encounter for screening examination for mental health and behavioral disorders, unspecified: Secondary | ICD-10-CM | POA: Diagnosis not present

## 2023-08-25 DIAGNOSIS — E221 Hyperprolactinemia: Secondary | ICD-10-CM | POA: Diagnosis not present

## 2023-08-25 DIAGNOSIS — Z0000 Encounter for general adult medical examination without abnormal findings: Secondary | ICD-10-CM | POA: Diagnosis not present

## 2023-08-25 DIAGNOSIS — R350 Frequency of micturition: Secondary | ICD-10-CM | POA: Diagnosis not present

## 2023-08-25 DIAGNOSIS — G43109 Migraine with aura, not intractable, without status migrainosus: Secondary | ICD-10-CM | POA: Diagnosis not present

## 2023-08-25 DIAGNOSIS — E559 Vitamin D deficiency, unspecified: Secondary | ICD-10-CM | POA: Diagnosis not present

## 2023-08-25 MED ORDER — TIZANIDINE HCL 4 MG PO TABS
4.0000 mg | ORAL_TABLET | Freq: Three times a day (TID) | ORAL | 3 refills | Status: DC | PRN
  Filled 2023-08-25: qty 270, 90d supply, fill #0

## 2023-08-25 MED ORDER — ESZOPICLONE 3 MG PO TABS
3.0000 mg | ORAL_TABLET | Freq: Every evening | ORAL | 1 refills | Status: AC | PRN
  Filled 2023-08-25: qty 90, 90d supply, fill #0
  Filled 2024-01-16: qty 90, 90d supply, fill #1

## 2023-08-25 MED ORDER — QULIPTA 60 MG PO TABS
60.0000 mg | ORAL_TABLET | Freq: Every day | ORAL | 1 refills | Status: DC
  Filled 2023-08-25: qty 90, 90d supply, fill #0

## 2023-08-30 ENCOUNTER — Ambulatory Visit
Admission: RE | Admit: 2023-08-30 | Discharge: 2023-08-30 | Disposition: A | Discharge status: Home / Self Care | Source: Ambulatory Visit | Attending: Family Medicine | Admitting: Family Medicine

## 2023-08-30 ENCOUNTER — Other Ambulatory Visit: Payer: Self-pay

## 2023-08-30 ENCOUNTER — Other Ambulatory Visit (HOSPITAL_COMMUNITY): Payer: Self-pay

## 2023-08-30 DIAGNOSIS — Z1231 Encounter for screening mammogram for malignant neoplasm of breast: Secondary | ICD-10-CM | POA: Insufficient documentation

## 2023-08-30 MED ORDER — ESCITALOPRAM OXALATE 10 MG PO TABS
10.0000 mg | ORAL_TABLET | Freq: Every day | ORAL | 3 refills | Status: AC
  Filled 2023-08-30: qty 90, 90d supply, fill #0
  Filled 2023-12-05: qty 90, 90d supply, fill #1
  Filled 2024-02-29: qty 90, 90d supply, fill #2

## 2023-08-30 MED ORDER — SUMATRIPTAN SUCCINATE 100 MG PO TABS: 100.0000 mg | ORAL_TABLET | Freq: Every day | ORAL | 3 refills | Status: AC

## 2023-08-30 MED ORDER — GABAPENTIN 100 MG PO CAPS
100.0000 mg | ORAL_CAPSULE | Freq: Every day | ORAL | 3 refills | Status: AC
  Filled 2023-08-30: qty 270, 90d supply, fill #0
  Filled 2023-12-05: qty 270, 90d supply, fill #1
  Filled 2024-02-29: qty 270, 90d supply, fill #2

## 2023-09-01 ENCOUNTER — Other Ambulatory Visit: Payer: Self-pay

## 2023-09-01 ENCOUNTER — Other Ambulatory Visit (HOSPITAL_COMMUNITY): Payer: Self-pay

## 2023-09-19 ENCOUNTER — Other Ambulatory Visit: Payer: Self-pay

## 2023-09-19 ENCOUNTER — Other Ambulatory Visit (HOSPITAL_COMMUNITY): Payer: Self-pay

## 2023-09-19 MED ORDER — ZOLPIDEM TARTRATE 10 MG PO TABS
10.0000 mg | ORAL_TABLET | Freq: Every evening | ORAL | 0 refills | Status: AC | PRN
  Filled 2023-09-19 – 2023-10-10 (×2): qty 90, 90d supply, fill #0

## 2023-10-10 ENCOUNTER — Other Ambulatory Visit (HOSPITAL_COMMUNITY): Payer: Self-pay

## 2023-10-10 ENCOUNTER — Other Ambulatory Visit: Payer: Self-pay

## 2023-10-20 ENCOUNTER — Other Ambulatory Visit (HOSPITAL_COMMUNITY): Payer: Self-pay

## 2023-10-20 DIAGNOSIS — G43719 Chronic migraine without aura, intractable, without status migrainosus: Secondary | ICD-10-CM | POA: Diagnosis not present

## 2023-10-20 DIAGNOSIS — R202 Paresthesia of skin: Secondary | ICD-10-CM | POA: Diagnosis not present

## 2023-10-20 MED ORDER — AJOVY 225 MG/1.5ML ~~LOC~~ SOAJ
225.0000 mg | SUBCUTANEOUS | 9 refills | Status: AC
  Filled 2023-10-20: qty 1.5, 30d supply, fill #0

## 2023-10-21 ENCOUNTER — Other Ambulatory Visit (HOSPITAL_COMMUNITY): Payer: Self-pay

## 2023-10-25 DIAGNOSIS — S63502A Unspecified sprain of left wrist, initial encounter: Secondary | ICD-10-CM | POA: Diagnosis not present

## 2023-10-25 DIAGNOSIS — S66912A Strain of unspecified muscle, fascia and tendon at wrist and hand level, left hand, initial encounter: Secondary | ICD-10-CM | POA: Diagnosis not present

## 2023-12-05 ENCOUNTER — Other Ambulatory Visit (HOSPITAL_COMMUNITY): Payer: Self-pay

## 2024-01-16 ENCOUNTER — Other Ambulatory Visit: Payer: Self-pay

## 2024-01-16 ENCOUNTER — Other Ambulatory Visit (HOSPITAL_COMMUNITY): Payer: Self-pay

## 2024-01-19 ENCOUNTER — Encounter (HOSPITAL_COMMUNITY): Payer: Self-pay

## 2024-01-19 ENCOUNTER — Other Ambulatory Visit (HOSPITAL_COMMUNITY): Payer: Self-pay

## 2024-01-26 DIAGNOSIS — H524 Presbyopia: Secondary | ICD-10-CM | POA: Diagnosis not present

## 2024-01-26 DIAGNOSIS — H43811 Vitreous degeneration, right eye: Secondary | ICD-10-CM | POA: Diagnosis not present

## 2024-01-26 DIAGNOSIS — H04123 Dry eye syndrome of bilateral lacrimal glands: Secondary | ICD-10-CM | POA: Diagnosis not present

## 2024-02-01 ENCOUNTER — Other Ambulatory Visit (HOSPITAL_COMMUNITY): Payer: Self-pay

## 2024-02-22 DIAGNOSIS — Z124 Encounter for screening for malignant neoplasm of cervix: Secondary | ICD-10-CM | POA: Diagnosis not present

## 2024-02-22 DIAGNOSIS — Z780 Asymptomatic menopausal state: Secondary | ICD-10-CM | POA: Diagnosis not present

## 2024-02-22 DIAGNOSIS — N939 Abnormal uterine and vaginal bleeding, unspecified: Secondary | ICD-10-CM | POA: Diagnosis not present

## 2024-02-22 DIAGNOSIS — N952 Postmenopausal atrophic vaginitis: Secondary | ICD-10-CM | POA: Diagnosis not present

## 2024-02-29 ENCOUNTER — Other Ambulatory Visit (HOSPITAL_COMMUNITY): Payer: Self-pay

## 2024-03-02 ENCOUNTER — Other Ambulatory Visit (HOSPITAL_COMMUNITY): Payer: Self-pay

## 2024-03-02 ENCOUNTER — Other Ambulatory Visit: Payer: Self-pay

## 2024-03-02 MED ORDER — GABAPENTIN 100 MG PO CAPS
100.0000 mg | ORAL_CAPSULE | Freq: Every day | ORAL | 3 refills | Status: AC
  Filled 2024-03-02: qty 300, 100d supply, fill #0

## 2024-03-02 MED ORDER — CYCLOBENZAPRINE HCL 10 MG PO TABS
10.0000 mg | ORAL_TABLET | Freq: Three times a day (TID) | ORAL | 5 refills | Status: AC | PRN
  Filled 2024-03-02: qty 100, 34d supply, fill #0

## 2024-03-02 MED ORDER — ESCITALOPRAM OXALATE 10 MG PO TABS
10.0000 mg | ORAL_TABLET | Freq: Every day | ORAL | 3 refills | Status: AC
  Filled 2024-03-02: qty 100, 100d supply, fill #0

## 2024-03-06 ENCOUNTER — Other Ambulatory Visit: Payer: Self-pay

## 2024-03-07 ENCOUNTER — Other Ambulatory Visit (HOSPITAL_COMMUNITY): Payer: Self-pay

## 2024-03-07 MED ORDER — ESTRADIOL 0.01 % VA CREA
1.0000 g | TOPICAL_CREAM | VAGINAL | 11 refills | Status: AC
  Filled 2024-03-07: qty 42.5, 90d supply, fill #0
  Filled 2024-04-25: qty 42.5, 90d supply, fill #1

## 2024-04-25 ENCOUNTER — Other Ambulatory Visit (HOSPITAL_COMMUNITY): Payer: Self-pay
